# Patient Record
Sex: Female | Born: 1985 | Race: White | Hispanic: No | Marital: Single | Smoking: Never smoker
Health system: Southern US, Community
[De-identification: ages and names within clinical notes are randomized; demographics above are authoritative.]

## PROBLEM LIST (undated history)

## (undated) ENCOUNTER — Inpatient Hospital Stay (HOSPITAL_COMMUNITY): Payer: Self-pay

## (undated) DIAGNOSIS — N852 Hypertrophy of uterus: Secondary | ICD-10-CM

## (undated) DIAGNOSIS — E301 Precocious puberty: Secondary | ICD-10-CM

## (undated) DIAGNOSIS — R51 Headache: Secondary | ICD-10-CM

## (undated) DIAGNOSIS — D219 Benign neoplasm of connective and other soft tissue, unspecified: Secondary | ICD-10-CM

## (undated) DIAGNOSIS — R519 Headache, unspecified: Secondary | ICD-10-CM

## (undated) DIAGNOSIS — E2839 Other primary ovarian failure: Secondary | ICD-10-CM

## (undated) DIAGNOSIS — M0230 Reiter's disease, unspecified site: Secondary | ICD-10-CM

---
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
## 2015-08-25 ENCOUNTER — Encounter: Payer: Self-pay | Admitting: Obstetrics & Gynecology

## 2016-03-01 DIAGNOSIS — E663 Overweight: Secondary | ICD-10-CM | POA: Insufficient documentation

## 2016-09-05 ENCOUNTER — Encounter: Payer: Self-pay | Admitting: Obstetrics & Gynecology

## 2016-09-06 ENCOUNTER — Encounter: Payer: Self-pay | Admitting: Obstetrics & Gynecology

## 2017-09-15 ENCOUNTER — Encounter: Payer: Self-pay | Admitting: Obstetrics & Gynecology

## 2017-09-15 ENCOUNTER — Ambulatory Visit (INDEPENDENT_AMBULATORY_CARE_PROVIDER_SITE_OTHER): Payer: Managed Care, Other (non HMO) | Admitting: Obstetrics & Gynecology

## 2017-09-15 VITALS — BP 130/82 | HR 78 | Ht 62.0 in | Wt 161.0 lb

## 2017-09-15 DIAGNOSIS — Z124 Encounter for screening for malignant neoplasm of cervix: Secondary | ICD-10-CM | POA: Diagnosis not present

## 2017-09-15 DIAGNOSIS — D219 Benign neoplasm of connective and other soft tissue, unspecified: Secondary | ICD-10-CM

## 2017-09-15 DIAGNOSIS — Z1151 Encounter for screening for human papillomavirus (HPV): Secondary | ICD-10-CM | POA: Diagnosis not present

## 2017-09-15 DIAGNOSIS — Z01419 Encounter for gynecological examination (general) (routine) without abnormal findings: Secondary | ICD-10-CM

## 2017-09-15 DIAGNOSIS — Z113 Encounter for screening for infections with a predominantly sexual mode of transmission: Secondary | ICD-10-CM

## 2017-09-15 DIAGNOSIS — N96 Recurrent pregnancy loss: Secondary | ICD-10-CM

## 2017-09-15 NOTE — Progress Notes (Signed)
First menses at 32 years old. Hx of x3 spontaneous abortion all first trimester losses.

## 2017-09-15 NOTE — Patient Instructions (Signed)
Recurrent Pregnancy Loss Recurrent pregnancy loss is the loss of three or more pregnancies before 20 weeks of gestation. Losing three or more pregnancies in a row is rare. What are the causes? The most common cause of recurrent pregnancy loss is an abnormal number of chromosomes in the developing baby (fetus). Chromosomes are the structures inside cells that hold all your genetic material. In most cases of recurrent pregnancy loss, a missing or extra chromosome keeps a baby from developing. It may not be possible to identify which chromosome is defective. Chromosome abnormalities can be inherited, but most of the time they occur by chance. Other possible causes of recurrent pregnancy loss include:  Being born with an abnormal womb structure (septate uterus).  Having noncancerous growths in your uterus (fibroids or polyps).  Having a disease that causes scarring in your uterus (Asherman syndrome).  Having a disease that causes your blood to clot (antiphospholipid syndrome).  Having a disease that increases bleeding (thrombophilia).  What increases the risk? The risk of recurrent pregnancy loss increases as your age increases. Other risk factors include:  Diabetes.  Thyroid disease.  Obesity.  Smoking.  Excessive alcohol or caffeine.  Recreational drug use.  What are the signs or symptoms?  Vaginal bleeding.  Passing clots vaginally.  Abdominal pain or cramps.  Low back pain. How is this diagnosed? Your health care provider can create an image of your womb using sound waves and a computer (ultrasound) to confirm your pregnancy by 5 or 6 weeks after you conceive. Ultrasound can also confirm a pregnancy loss. Your health care provider may do a complete physical exam, including your vagina and uterus (pelvic exam), to find possible causes of recurrent pregnancy loss. Other tests may include:  Ultrasound imaging to see if the structure of your uterus is normal or if you have  polyps or fibroids.  Blood tests to see if you have a disease that causes recurrent pregnancy loss.  Blood tests to see if your blood clots normally.  Genetic testing of you and your partner.  How is this treated? In many cases, there is no specific treatment. Depending on the cause, possible treatments include:  Having your eggs fertilized outside your uterus (in vitro fertilization). By doing this, a health care provider may be able to select eggs without chromosome abnormalities.  Taking a blood thinner to prevent clotting if you have antiphospholipid syndrome.  Having corrective surgery if you have an abnormality in your uterus.  Follow these instructions at home: Follow all your health care provider's home instructions carefully. These may include:  Do not smoke or use recreational drugs.  Do not drink alcohol if you are pregnant.  Do not put anything in your vagina or have sex for 2 weeks after you have had a miscarriage.  If you are Rh negative and have had a miscarriage, you may need to get a shot of Rho (D) immune globulin. Ask your doctor if you need this shot.  Use birth control if you do not want to get pregnant. You can get pregnant [redacted] weeks after a miscarriage.  Get support from friends and loved ones. Miscarriage can be a sad and stressful event.  Contact a health care provider if:  You have light vaginal bleeding or spotting while pregnant.  You have been trying to get pregnant without success.  You are struggling with sadness or depression after a miscarriage. Get help right away if:  You have heavy vaginal bleeding and abdominal cramps.  You  have fever, chills, and severe abdominal pain. This information is not intended to replace advice given to you by your health care provider. Make sure you discuss any questions you have with your health care provider. Document Released: 09/18/2007 Document Revised: 09/07/2015 Document Reviewed: 01/22/2013 Elsevier  Interactive Patient Education  2018 [employer].

## 2017-09-15 NOTE — Progress Notes (Signed)
Subjective:     April Weiss is a 32 y.o. female here for a routine exam. G3P0030  LMP  08/22/2017 25-26 day cycle. Cycles last last 5 days. Current complaints:  Pt has had 3 SABs. She has had a pregnancy every 2 years. SAB 11 weeks in 2014; SAB 13 weeks 2017 and SAB 9 weeks 2018. Pt has never had a workup for recurrent SABs. Pt has never been on contraception. Pt has been married for 7 years. Pt has no prev sexual partners. Spouse has no children.  Pts husband is prediabetic. He has a not of anxiety (not diagnosed).   Pt passed all tissue spontaneously. The last 2 were missed ABs and pt received Cytotec.  She was seen at some point to by [facility] and had an US done as was told that she had a insignificant uterine septum. She was also told that she had a fibroid.  She denies heavy bleeding.        Gynecologic History No LMP recorded. Contraception: none Last Pap:  3 years prev. Results were: normal Last mammogram: n/a.   Obstetric History OB History  Gravida Para Term Preterm AB Living  3       2    SAB TAB Ectopic Multiple Live Births  2            # Outcome Date GA Lbr Len/2nd Weight Sex Delivery Anes PTL Lv  3 SAB 2017          2 SAB 2014          1 Gravida             The following portions of the patient's history were reviewed and updated as appropriate: allergies, current medications, past family history, past medical history, past social history, past surgical history and problem list.  Review of Systems Pertinent items are noted in HPI.    Objective:  BP 130/82   Pulse 78   Ht 5\' 2"  (1.575 m)   Wt 161 lb (73 kg)   LMP 07/23/2017 (Exact Date)   BMI 29.45 kg/m  General Appearance:    Alert, cooperative, no distress, appears stated age  Head:    Normocephalic, without obvious abnormality, atraumatic  Eyes:    conjunctiva/corneas clear, EOM's intact, both eyes  Ears:    Normal external ear canals, both ears  Nose:   Nares normal, septum midline, mucosa normal, no drainage     or sinus tenderness  Throat:   Lips, mucosa, and tongue normal; teeth and gums normal  Neck:   Supple, symmetrical, trachea midline, no adenopathy;    thyroid:  no enlargement/tenderness/nodules  Back:     Symmetric, no curvature, ROM normal, no CVA tenderness  Lungs:     Clear to auscultation bilaterally, respirations unlabored  Chest Wall:    No tenderness or deformity   Heart:    Regular rate and rhythm, S1 and S2 normal, no murmur, rub   or gallop  Breast Exam:    No tenderness, masses, or nipple abnormality  Abdomen:     Soft, non-tender, bowel sounds active all four quadrants,    no masses, no organomegaly  Genitalia:    Normal female without lesion, discharge or tenderness; the uterus is enlarged to 10-12 weeks. Difficult to fully assess due to pts body habitus.      Extremities:   Extremities normal, atraumatic, no cyanosis or edema  Pulses:   2+ and symmetric all extremities  Skin:   Skin  color, texture, turgor normal, no rashes or lesions     Assessment:    Healthy female exam.   recurrent pregnancy loss H/o uterine fibroids   Plan:   F/u PAP and cx Labs: anticardiolipin IgG and IgM; Lupus anticoagulant; HgbA1c, TSH GYN [place] Pt will f/u in 4 weeks. Consider dx hysteroscopy to eval endometrium  PNV 1 po q day  Lakiya Cottam L. Harraway-Smith, M.D., [place]     Keep PNV

## 2017-09-17 LAB — HEMOGLOBIN A1C
Est. average glucose Bld gHb Est-mCnc: 111 mg/dL
HEMOGLOBIN A1C: 5.5 % (ref 4.8–5.6)

## 2017-09-17 LAB — CARDIOLIPIN ANTIBODIES, IGM+IGG
Anticardiolipin IgG: <9 GPL U/mL (ref 0–14)
Anticardiolipin IgM: <9 MPL U/mL (ref 0–12)

## 2017-09-17 LAB — CYTOLOGY - PAP
CHLAMYDIA, DNA PROBE: NEGATIVE
Diagnosis: NEGATIVE
HPV: NOT DETECTED
Neisseria Gonorrhea: NEGATIVE

## 2017-09-17 LAB — LUPUS ANTICOAGULANT PANEL
DRVVT: 29.9 s (ref 0.0–47.0)
PTT LA: 36.6 s (ref 0.0–51.9)

## 2017-09-17 LAB — TSH: TSH: 4.35 u[IU]/mL (ref 0.450–4.500)

## 2017-09-20 ENCOUNTER — Ambulatory Visit (HOSPITAL_BASED_OUTPATIENT_CLINIC_OR_DEPARTMENT_OTHER)
Admission: RE | Admit: 2017-09-20 | Discharge: 2017-09-20 | Disposition: A | Discharge status: Home / Self Care | Payer: Managed Care, Other (non HMO) | Source: Ambulatory Visit | Attending: Obstetrics & Gynecology | Admitting: Obstetrics & Gynecology

## 2017-09-20 DIAGNOSIS — D219 Benign neoplasm of connective and other soft tissue, unspecified: Secondary | ICD-10-CM

## 2017-09-20 DIAGNOSIS — N96 Recurrent pregnancy loss: Secondary | ICD-10-CM

## 2017-09-20 DIAGNOSIS — D259 Leiomyoma of uterus, unspecified: Secondary | ICD-10-CM | POA: Insufficient documentation

## 2017-09-23 ENCOUNTER — Telehealth: Payer: Self-pay

## 2017-09-23 NOTE — Telephone Encounter (Signed)
Called pt with results. Got vm. Left message to call our office back.

## 2017-09-23 NOTE — Telephone Encounter (Signed)
-----   Message from Lavonia Drafts, MD sent at 09/23/2017 11:38 AM EDT ----- please call pt. All of her labs are normal.  Can discuss the [place] more when she comes in on the 3rd. Small fibroid noted.  THx, clh-S

## 2017-09-24 NOTE — Telephone Encounter (Signed)
Patient made aware that lab results normal and that her ultrasound did show a small fibroid. Patient made aware that Dr. Ihor Dow will discuss this further with her at that appointment. Kathrene Alu RN

## 2017-09-24 NOTE — Progress Notes (Signed)
Patient made aware of results and plans to come to follow up appointment on July 3rd

## 2017-09-24 NOTE — Telephone Encounter (Signed)
-----   Message from Lavonia Drafts, MD sent at 09/23/2017 11:38 AM EDT ----- please call pt. All of her labs are normal.  Can discuss the [place] more when she comes in on the 3rd. Small fibroid noted.  THx, clh-S

## 2017-09-29 ENCOUNTER — Observation Stay (HOSPITAL_COMMUNITY)
Admission: AD | Admit: 2017-09-29 | Discharge: 2017-09-30 | Disposition: A | Discharge status: Home / Self Care | Payer: Managed Care, Other (non HMO) | Source: Ambulatory Visit | Attending: General Surgery | Admitting: General Surgery

## 2017-09-29 ENCOUNTER — Other Ambulatory Visit: Payer: Self-pay

## 2017-09-29 ENCOUNTER — Telehealth: Payer: Self-pay

## 2017-09-29 ENCOUNTER — Encounter (HOSPITAL_COMMUNITY): Payer: Self-pay

## 2017-09-29 ENCOUNTER — Encounter (HOSPITAL_COMMUNITY)
Admission: AD | Disposition: A | Discharge status: Home / Self Care | Payer: Self-pay | Source: Ambulatory Visit | Attending: Emergency Medicine

## 2017-09-29 ENCOUNTER — Inpatient Hospital Stay (HOSPITAL_COMMUNITY): Payer: Managed Care, Other (non HMO) | Admitting: Certified Registered Nurse Anesthetist

## 2017-09-29 ENCOUNTER — Ambulatory Visit: Payer: Managed Care, Other (non HMO) | Admitting: *Deleted

## 2017-09-29 ENCOUNTER — Inpatient Hospital Stay (HOSPITAL_COMMUNITY): Payer: Managed Care, Other (non HMO)

## 2017-09-29 DIAGNOSIS — K3580 Unspecified acute appendicitis: Secondary | ICD-10-CM | POA: Diagnosis not present

## 2017-09-29 DIAGNOSIS — Z8249 Family history of ischemic heart disease and other diseases of the circulatory system: Secondary | ICD-10-CM | POA: Diagnosis not present

## 2017-09-29 DIAGNOSIS — K3589 Other acute appendicitis without perforation or gangrene: Secondary | ICD-10-CM | POA: Diagnosis present

## 2017-09-29 DIAGNOSIS — M0230 Reiter's disease, unspecified site: Secondary | ICD-10-CM | POA: Insufficient documentation

## 2017-09-29 DIAGNOSIS — N852 Hypertrophy of uterus: Secondary | ICD-10-CM | POA: Diagnosis not present

## 2017-09-29 DIAGNOSIS — Z880 Allergy status to penicillin: Secondary | ICD-10-CM | POA: Diagnosis not present

## 2017-09-29 HISTORY — DX: Other primary ovarian failure: E28.39

## 2017-09-29 HISTORY — DX: Headache, unspecified: R51.9

## 2017-09-29 HISTORY — DX: Precocious puberty: E30.1

## 2017-09-29 HISTORY — DX: Benign neoplasm of connective and other soft tissue, unspecified: D21.9

## 2017-09-29 HISTORY — DX: Headache: R51

## 2017-09-29 HISTORY — DX: Hypertrophy of uterus: N85.2

## 2017-09-29 HISTORY — PX: LAPAROSCOPIC APPENDECTOMY: SHX408

## 2017-09-29 HISTORY — DX: Reiter's disease, unspecified site: M02.30

## 2017-09-29 LAB — CBC WITH DIFFERENTIAL/PLATELET
Basophils Absolute: 0 10*3/uL (ref 0.0–0.1)
Basophils Relative: 0 %
EOS ABS: 0.3 10*3/uL (ref 0.0–0.7)
Eosinophils Relative: 2 %
HEMATOCRIT: 37.6 % (ref 36.0–46.0)
HEMOGLOBIN: 12.7 g/dL (ref 12.0–15.0)
Lymphocytes Relative: 14 %
Lymphs Abs: 2.1 10*3/uL (ref 0.7–4.0)
MCH: 28.9 pg (ref 26.0–34.0)
MCHC: 33.8 g/dL (ref 30.0–36.0)
MCV: 85.5 fL (ref 78.0–100.0)
MONOS PCT: 2 %
Monocytes Absolute: 0.3 10*3/uL (ref 0.1–1.0)
NEUTROS ABS: 12.1 10*3/uL — AB (ref 1.7–7.7)
NEUTROS PCT: 82 %
Platelets: 282 10*3/uL (ref 150–400)
RBC: 4.4 MIL/uL (ref 3.87–5.11)
RDW: 13.4 % (ref 11.5–15.5)
WBC: 14.7 10*3/uL — ABNORMAL HIGH (ref 4.0–10.5)

## 2017-09-29 LAB — URINALYSIS, ROUTINE W REFLEX MICROSCOPIC
BILIRUBIN URINE: NEGATIVE
GLUCOSE, UA: NEGATIVE mg/dL
KETONES UR: NEGATIVE mg/dL
NITRITE: NEGATIVE
PH: 5 (ref 5.0–8.0)
PROTEIN: NEGATIVE mg/dL
Specific Gravity, Urine: 1.023 (ref 1.005–1.030)

## 2017-09-29 LAB — BASIC METABOLIC PANEL
ANION GAP: 7 (ref 5–15)
BUN: 13 mg/dL (ref 6–20)
CHLORIDE: 104 mmol/L (ref 101–111)
CO2: 26 mmol/L (ref 22–32)
Calcium: 9.1 mg/dL (ref 8.9–10.3)
Creatinine, Ser: 0.55 mg/dL (ref 0.44–1.00)
GFR calc Af Amer: >60 mL/min (ref >60)
GFR calc non Af Amer: >60 mL/min (ref >60)
GLUCOSE: 93 mg/dL (ref 65–99)
POTASSIUM: 3.6 mmol/L (ref 3.5–5.1)
Sodium: 137 mmol/L (ref 135–145)

## 2017-09-29 LAB — POCT PREGNANCY, URINE: Preg Test, Ur: NEGATIVE

## 2017-09-29 LAB — T4, FREE: Free T4: 0.86 ng/dL (ref 0.82–1.77)

## 2017-09-29 SURGERY — APPENDECTOMY, LAPAROSCOPIC
Anesthesia: General | Site: Abdomen

## 2017-09-29 MED ORDER — OXYCODONE HCL 5 MG/5ML PO SOLN
5.0000 mg | Freq: Once | ORAL | Status: DC | PRN
  Filled 2017-09-29: qty 5

## 2017-09-29 MED ORDER — BUPIVACAINE-EPINEPHRINE 0.25% -1:200000 IJ SOLN
INTRAMUSCULAR | Status: DC | PRN
  Administered 2017-09-29: 20 mL

## 2017-09-29 MED ORDER — HEPARIN SODIUM (PORCINE) 5000 UNIT/ML IJ SOLN
5000.0000 [IU] | Freq: Three times a day (TID) | INTRAMUSCULAR | Status: DC
  Administered 2017-09-30: 5000 [IU] via SUBCUTANEOUS
  Filled 2017-09-29: qty 1

## 2017-09-29 MED ORDER — DEXAMETHASONE SODIUM PHOSPHATE 10 MG/ML IJ SOLN
INTRAMUSCULAR | Status: AC
  Filled 2017-09-29: qty 1

## 2017-09-29 MED ORDER — ONDANSETRON HCL 4 MG/2ML IJ SOLN
INTRAMUSCULAR | Status: DC | PRN
  Administered 2017-09-29: 4 mg via INTRAVENOUS

## 2017-09-29 MED ORDER — SUGAMMADEX SODIUM 200 MG/2ML IV SOLN
INTRAVENOUS | Status: AC
  Filled 2017-09-29: qty 2

## 2017-09-29 MED ORDER — CIPROFLOXACIN IN D5W 400 MG/200ML IV SOLN
INTRAVENOUS | Status: DC | PRN
  Administered 2017-09-29: 400 mg via INTRAVENOUS

## 2017-09-29 MED ORDER — LACTATED RINGERS IR SOLN
Status: DC | PRN
  Administered 2017-09-29: 1000 mL

## 2017-09-29 MED ORDER — BUPIVACAINE-EPINEPHRINE (PF) 0.25% -1:200000 IJ SOLN
INTRAMUSCULAR | Status: AC
  Filled 2017-09-29: qty 30

## 2017-09-29 MED ORDER — METRONIDAZOLE IN NACL 5-0.79 MG/ML-% IV SOLN
INTRAVENOUS | Status: AC
  Filled 2017-09-29: qty 100

## 2017-09-29 MED ORDER — MEPERIDINE HCL 50 MG/ML IJ SOLN: 6.2500 mg | INTRAMUSCULAR | Status: DC | PRN

## 2017-09-29 MED ORDER — LACTATED RINGERS IV SOLN
INTRAVENOUS | Status: DC
  Administered 2017-09-29: 23:00:00 via INTRAVENOUS

## 2017-09-29 MED ORDER — DEXAMETHASONE SODIUM PHOSPHATE 4 MG/ML IJ SOLN
INTRAMUSCULAR | Status: DC | PRN
  Administered 2017-09-29: 10 mg via INTRAVENOUS

## 2017-09-29 MED ORDER — ACETAMINOPHEN 325 MG PO TABS: 325.0000 mg | ORAL_TABLET | ORAL | Status: DC | PRN

## 2017-09-29 MED ORDER — IOPAMIDOL (ISOVUE-300) INJECTION 61%
100.0000 mL | Freq: Once | INTRAVENOUS | Status: AC | PRN
  Administered 2017-09-29: 100 mL via INTRAVENOUS

## 2017-09-29 MED ORDER — FENTANYL CITRATE (PF) 100 MCG/2ML IJ SOLN
INTRAMUSCULAR | Status: AC
  Filled 2017-09-29: qty 2

## 2017-09-29 MED ORDER — HYDROMORPHONE HCL 1 MG/ML IJ SOLN
0.5000 mg | INTRAMUSCULAR | Status: DC | PRN
Start: 2017-09-29 — End: 2017-09-30

## 2017-09-29 MED ORDER — OXYCODONE HCL 5 MG PO TABS: 5.0000 mg | ORAL_TABLET | Freq: Once | ORAL | Status: DC | PRN

## 2017-09-29 MED ORDER — SCOPOLAMINE 1 MG/3DAYS TD PT72
MEDICATED_PATCH | TRANSDERMAL | Status: DC | PRN
  Administered 2017-09-29: 1 via TRANSDERMAL

## 2017-09-29 MED ORDER — PROPOFOL 10 MG/ML IV BOLUS
INTRAVENOUS | Status: AC
  Filled 2017-09-29: qty 20

## 2017-09-29 MED ORDER — METRONIDAZOLE IN NACL 5-0.79 MG/ML-% IV SOLN
INTRAVENOUS | Status: DC | PRN
  Administered 2017-09-29: 500 mg via INTRAVENOUS

## 2017-09-29 MED ORDER — ACETAMINOPHEN 160 MG/5ML PO SOLN: 325.0000 mg | ORAL | Status: DC | PRN

## 2017-09-29 MED ORDER — LIDOCAINE 2% (20 MG/ML) 5 ML SYRINGE
INTRAMUSCULAR | Status: AC
  Filled 2017-09-29: qty 5

## 2017-09-29 MED ORDER — ONDANSETRON 4 MG PO TBDP: 4.0000 mg | ORAL_TABLET | Freq: Four times a day (QID) | ORAL | Status: DC | PRN

## 2017-09-29 MED ORDER — ONDANSETRON HCL 4 MG/2ML IJ SOLN: 4.0000 mg | Freq: Four times a day (QID) | INTRAMUSCULAR | Status: DC | PRN

## 2017-09-29 MED ORDER — DIPHENHYDRAMINE HCL 50 MG/ML IJ SOLN: 12.5000 mg | Freq: Four times a day (QID) | INTRAMUSCULAR | Status: DC | PRN

## 2017-09-29 MED ORDER — MIDAZOLAM HCL 2 MG/2ML IJ SOLN
INTRAMUSCULAR | Status: AC
  Filled 2017-09-29: qty 2

## 2017-09-29 MED ORDER — SUGAMMADEX SODIUM 200 MG/2ML IV SOLN
INTRAVENOUS | Status: DC | PRN
  Administered 2017-09-29: 200 mg via INTRAVENOUS

## 2017-09-29 MED ORDER — ROCURONIUM BROMIDE 100 MG/10ML IV SOLN
INTRAVENOUS | Status: AC
  Filled 2017-09-29: qty 1

## 2017-09-29 MED ORDER — DIPHENHYDRAMINE HCL 12.5 MG/5ML PO ELIX: 12.5000 mg | ORAL_SOLUTION | Freq: Four times a day (QID) | ORAL | Status: DC | PRN

## 2017-09-29 MED ORDER — PHENYLEPHRINE 40 MCG/ML (10ML) SYRINGE FOR IV PUSH (FOR BLOOD PRESSURE SUPPORT)
PREFILLED_SYRINGE | INTRAVENOUS | Status: DC | PRN
  Administered 2017-09-29 (×3): 80 ug via INTRAVENOUS

## 2017-09-29 MED ORDER — BUPIVACAINE HCL (PF) 0.5 % IJ SOLN
INTRAMUSCULAR | Status: AC
  Filled 2017-09-29: qty 30

## 2017-09-29 MED ORDER — MIDAZOLAM HCL 5 MG/5ML IJ SOLN
INTRAMUSCULAR | Status: DC | PRN
  Administered 2017-09-29: 2 mg via INTRAVENOUS

## 2017-09-29 MED ORDER — PROPOFOL 10 MG/ML IV BOLUS
INTRAVENOUS | Status: DC | PRN
  Administered 2017-09-29: 150 mg via INTRAVENOUS

## 2017-09-29 MED ORDER — HYDRALAZINE HCL 20 MG/ML IJ SOLN: 10.0000 mg | INTRAMUSCULAR | Status: DC | PRN

## 2017-09-29 MED ORDER — TRAMADOL HCL 50 MG PO TABS: 50.0000 mg | ORAL_TABLET | Freq: Four times a day (QID) | ORAL | Status: DC | PRN

## 2017-09-29 MED ORDER — ONDANSETRON HCL 4 MG/2ML IJ SOLN: 4.0000 mg | Freq: Once | INTRAMUSCULAR | Status: DC | PRN

## 2017-09-29 MED ORDER — DOCUSATE SODIUM 100 MG PO CAPS
200.0000 mg | ORAL_CAPSULE | Freq: Two times a day (BID) | ORAL | Status: DC
  Administered 2017-09-30: 200 mg via ORAL
  Filled 2017-09-29: qty 2

## 2017-09-29 MED ORDER — IBUPROFEN 200 MG PO TABS
600.0000 mg | ORAL_TABLET | Freq: Four times a day (QID) | ORAL | Status: DC
  Administered 2017-09-29 – 2017-09-30 (×3): 600 mg via ORAL
  Filled 2017-09-29 (×3): qty 3

## 2017-09-29 MED ORDER — CIPROFLOXACIN IN D5W 400 MG/200ML IV SOLN
INTRAVENOUS | Status: AC
  Filled 2017-09-29: qty 200

## 2017-09-29 MED ORDER — 0.9 % SODIUM CHLORIDE (POUR BTL) OPTIME
TOPICAL | Status: DC | PRN
  Administered 2017-09-29: 1000 mL

## 2017-09-29 MED ORDER — FENTANYL CITRATE (PF) 100 MCG/2ML IJ SOLN
INTRAMUSCULAR | Status: DC | PRN
  Administered 2017-09-29 (×4): 50 ug via INTRAVENOUS

## 2017-09-29 MED ORDER — ACETAMINOPHEN 500 MG PO TABS
1000.0000 mg | ORAL_TABLET | Freq: Four times a day (QID) | ORAL | Status: DC
  Administered 2017-09-30: 1000 mg via ORAL
  Filled 2017-09-29: qty 2

## 2017-09-29 MED ORDER — ONDANSETRON HCL 4 MG/2ML IJ SOLN
INTRAMUSCULAR | Status: AC
  Filled 2017-09-29: qty 2

## 2017-09-29 MED ORDER — FENTANYL CITRATE (PF) 100 MCG/2ML IJ SOLN
25.0000 ug | INTRAMUSCULAR | Status: DC | PRN
  Administered 2017-09-29: 25 ug via INTRAVENOUS

## 2017-09-29 MED ORDER — FENTANYL CITRATE (PF) 100 MCG/2ML IJ SOLN
INTRAMUSCULAR | Status: AC
  Administered 2017-09-29: 25 ug via INTRAVENOUS
  Filled 2017-09-29: qty 2

## 2017-09-29 MED ORDER — LACTATED RINGERS IV SOLN
INTRAVENOUS | Status: DC | PRN
  Administered 2017-09-29 (×2): via INTRAVENOUS

## 2017-09-29 MED ORDER — SIMETHICONE 80 MG PO CHEW: 40.0000 mg | CHEWABLE_TABLET | Freq: Four times a day (QID) | ORAL | Status: DC | PRN

## 2017-09-29 MED ORDER — SCOPOLAMINE 1 MG/3DAYS TD PT72
MEDICATED_PATCH | TRANSDERMAL | Status: AC
  Filled 2017-09-29: qty 1

## 2017-09-29 MED ORDER — SUCCINYLCHOLINE CHLORIDE 200 MG/10ML IV SOSY
PREFILLED_SYRINGE | INTRAVENOUS | Status: AC
  Filled 2017-09-29: qty 10

## 2017-09-29 MED ORDER — PHENYLEPHRINE 40 MCG/ML (10ML) SYRINGE FOR IV PUSH (FOR BLOOD PRESSURE SUPPORT)
PREFILLED_SYRINGE | INTRAVENOUS | Status: AC
  Filled 2017-09-29: qty 10

## 2017-09-29 MED ORDER — SUCCINYLCHOLINE CHLORIDE 200 MG/10ML IV SOSY
PREFILLED_SYRINGE | INTRAVENOUS | Status: DC | PRN
  Administered 2017-09-29: 100 mg via INTRAVENOUS

## 2017-09-29 MED ORDER — LIDOCAINE 2% (20 MG/ML) 5 ML SYRINGE
INTRAMUSCULAR | Status: DC | PRN
  Administered 2017-09-29: 100 mg via INTRAVENOUS

## 2017-09-29 MED ORDER — ROCURONIUM BROMIDE 10 MG/ML (PF) SYRINGE
PREFILLED_SYRINGE | INTRAVENOUS | Status: DC | PRN
  Administered 2017-09-29: 30 mg via INTRAVENOUS
  Administered 2017-09-29: 10 mg via INTRAVENOUS

## 2017-09-29 SURGICAL SUPPLY — 43 items
APPLIER CLIP 5 13 M/L LIGAMAX5 (MISCELLANEOUS)
APPLIER CLIP ROT 10 11.4 M/L (STAPLE)
BENZOIN TINCTURE PRP APPL 2/3 (GAUZE/BANDAGES/DRESSINGS) ×3 IMPLANT
CABLE HIGH FREQUENCY MONO STRZ (ELECTRODE) ×3 IMPLANT
CHLORAPREP W/TINT 26ML (MISCELLANEOUS) ×3 IMPLANT
CLIP APPLIE 5 13 M/L LIGAMAX5 (MISCELLANEOUS) IMPLANT
CLIP APPLIE ROT 10 11.4 M/L (STAPLE) IMPLANT
CLOSURE WOUND 1/2 X4 (GAUZE/BANDAGES/DRESSINGS) ×1
CUTTER FLEX LINEAR 45M (STAPLE) ×3 IMPLANT
DECANTER SPIKE VIAL GLASS SM (MISCELLANEOUS) ×3 IMPLANT
DRAIN CHANNEL 19F RND (DRAIN) IMPLANT
DRAPE LAPAROSCOPIC ABDOMINAL (DRAPES) ×3 IMPLANT
DRSG TEGADERM 2-3/8X2-3/4 SM (GAUZE/BANDAGES/DRESSINGS) ×9 IMPLANT
ELECT REM PT RETURN 15FT ADLT (MISCELLANEOUS) ×3 IMPLANT
ENDOLOOP SUT PDS II  0 18 (SUTURE)
ENDOLOOP SUT PDS II 0 18 (SUTURE) IMPLANT
EVACUATOR SILICONE 100CC (DRAIN) IMPLANT
GAUZE SPONGE 2X2 8PLY STRL LF (GAUZE/BANDAGES/DRESSINGS) ×1 IMPLANT
GLOVE ECLIPSE 8.0 STRL XLNG CF (GLOVE) ×3 IMPLANT
GLOVE INDICATOR 8.0 STRL GRN (GLOVE) ×3 IMPLANT
GOWN STRL REUS W/TWL XL LVL3 (GOWN DISPOSABLE) ×6 IMPLANT
KIT BASIN OR (CUSTOM PROCEDURE TRAY) ×3 IMPLANT
PENCIL HANDSWITCHING (ELECTRODE) ×3 IMPLANT
POUCH SPECIMEN RETRIEVAL 10MM (ENDOMECHANICALS) ×3 IMPLANT
RELOAD 45 VASCULAR/THIN (ENDOMECHANICALS) IMPLANT
RELOAD STAPLE TA45 3.5 REG BLU (ENDOMECHANICALS) ×3 IMPLANT
SCISSORS LAP 5X35 DISP (ENDOMECHANICALS) IMPLANT
SET IRRIG TUBING LAPAROSCOPIC (IRRIGATION / IRRIGATOR) ×3 IMPLANT
SHEARS CURVED HARMONIC AC 45CM (MISCELLANEOUS) ×3 IMPLANT
SHEARS HARMONIC ACE PLUS 36CM (ENDOMECHANICALS) IMPLANT
SLEEVE XCEL OPT CAN 5 100 (ENDOMECHANICALS) ×3 IMPLANT
SPONGE GAUZE 2X2 STER 10/PKG (GAUZE/BANDAGES/DRESSINGS) ×2
STRIP CLOSURE SKIN 1/2X4 (GAUZE/BANDAGES/DRESSINGS) ×2 IMPLANT
SUT ETHILON 3 0 PS 1 (SUTURE) IMPLANT
SUT MNCRL AB 4-0 PS2 18 (SUTURE) ×3 IMPLANT
TOWEL OR 17X26 10 PK STRL BLUE (TOWEL DISPOSABLE) ×3 IMPLANT
TOWEL OR NON WOVEN STRL DISP B (DISPOSABLE) ×3 IMPLANT
TRAY FOLEY MTR SLVR 14FR STAT (SET/KITS/TRAYS/PACK) IMPLANT
TRAY FOLEY MTR SLVR 16FR STAT (SET/KITS/TRAYS/PACK) IMPLANT
TRAY LAPAROSCOPIC (CUSTOM PROCEDURE TRAY) ×3 IMPLANT
TROCAR BLADELESS OPT 5 100 (ENDOMECHANICALS) ×3 IMPLANT
TROCAR XCEL BLUNT TIP 100MML (ENDOMECHANICALS) ×3 IMPLANT
TUBING INSUF HEATED (TUBING) ×3 IMPLANT

## 2017-09-29 NOTE — ED Notes (Signed)
Bed: WEMS01 Expected date:  Expected time:  Means of arrival:  Comments: EMS-[facility]

## 2017-09-29 NOTE — MAU Note (Signed)
Pt presents to MAU with complaints of lower abdominal pain with pelvic pressure. LMP 08/22/17 NEG pregnancy test History of fibroids and cysts .

## 2017-09-29 NOTE — Telephone Encounter (Signed)
Patient stating that she is now two weeks late with her period. An at home pregnancy test she took today was negative. Patient would like to have blood drawn cause her breasts are tender as well.patient would like to go to [place] office since she lives in [place]- Will call office to see if she can be scheduled. Kathrene Alu RN

## 2017-09-29 NOTE — Plan of Care (Signed)
Care plan initiated.

## 2017-09-29 NOTE — Anesthesia Procedure Notes (Signed)
Procedure Name: Intubation Date/Time: 09/29/2017 7:04 PM Performed by: Claudia Desanctis, CRNA Pre-anesthesia Checklist: Patient identified, Emergency Drugs available, Suction available and Patient being monitored Patient Re-evaluated:Patient Re-evaluated prior to induction Oxygen Delivery Method: Circle system utilized Preoxygenation: Pre-oxygenation with 100% oxygen Induction Type: IV induction, Rapid sequence and Cricoid Pressure applied Laryngoscope Size: 2 and Miller Grade View: Grade I Tube type: Oral Tube size: 7.5 mm Number of attempts: 1 Airway Equipment and Method: Stylet Placement Confirmation: ETT inserted through vocal cords under direct vision,  positive ETCO2 and breath sounds checked- equal and bilateral Secured at: 21 cm Tube secured with: Tape Dental Injury: Teeth and Oropharynx as per pre-operative assessment

## 2017-09-29 NOTE — ED Provider Notes (Signed)
  Physical Exam  BP 131/86 (BP Location: Right Arm)   Pulse 91   Temp 98.3 F (36.8 C) (Oral)   Resp 16   Ht 5\' 2"  (1.575 m)   Wt 73 kg (161 lb)   LMP 08/22/2017   SpO2 100%   BMI 29.45 kg/m   Physical Exam  ED Course/Procedures     Procedures  MDM  Transfer from [facility] for acute appendicitis.  Well-appearing and surgeon is already in the room with her.       Davonna Belling, MD 09/29/17 1755

## 2017-09-29 NOTE — H&P (Addendum)
CC: Transferred from [facility] for evaluation for acute appendicitis  HPI: April Weiss is an 32 y.o. female with no known past medical history presented to [facility] earlier today with acute onset of right sided abdominal pain. Pain started when she woke this morning and was sharp/achy. She has never had this before. Pain did not radiate. She has had associated chills but no fever. Had loss of appetite as well. Nausea, but no emesis.  Past Medical History:  Diagnosis Date  . Early puberty   . Estrogen deficiency   . Fibroid   . Headache   . Large uterus   . 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 No pertinent surgical history.  Family History  Problem Relation Age of Onset  . Arthritis Father   . Hypertension Mother     Social:  reports that she has never smoked. She has never used smokeless tobacco. She reports that she does not drink alcohol or use drugs.  Allergies:  Allergies  Allergen Reactions  . Penicillins Hives    Has patient had a PCN reaction causing immediate rash, facial/tongue/throat swelling, SOB or lightheadedness with hypotension: Yes Has patient had a PCN reaction causing severe rash involving mucus membranes or skin necrosis: No Has patient had a PCN reaction that required hospitalization: No Has patient had a PCN reaction occurring within the last 10 years: No If all of the above answers are "NO", then may proceed with Cephalosporin use.    Medications: I have reviewed the patient's current medications.  Results for orders placed or performed during the hospital encounter of 09/29/17 (from the past 48 hour(s))  Urinalysis, Routine w reflex microscopic     Status: Abnormal   Collection Time: 09/29/17  1:08 PM  Result Value Ref Range   Color, Urine YELLOW YELLOW   APPearance HAZY (A) CLEAR   Specific Gravity, Urine 1.023 1.005 - 1.030   pH 5.0 5.0 - 8.0   Glucose, UA NEGATIVE NEGATIVE mg/dL   Hgb urine dipstick SMALL (A) NEGATIVE     Bilirubin Urine NEGATIVE NEGATIVE   Ketones, ur NEGATIVE NEGATIVE mg/dL   Protein, ur NEGATIVE NEGATIVE mg/dL   Nitrite NEGATIVE NEGATIVE   Leukocytes, UA LARGE (A) NEGATIVE   RBC / HPF 0-5 0 - 5 RBC/hpf   WBC, UA >50 (H) 0 - 5 WBC/hpf   Bacteria, UA RARE (A) NONE SEEN   Squamous Epithelial / LPF 6-10 0 - 5   Mucus PRESENT     Comment: Performed at [facility], [address] [place], [place] [address]  Pregnancy, urine POC     Status: None   Collection Time: 09/29/17  1:29 PM  Result Value Ref Range   Preg Test, Ur NEGATIVE NEGATIVE    Comment:        THE SENSITIVITY OF THIS METHODOLOGY IS >24 mIU/mL   CBC with Differential     Status: Abnormal   Collection Time: 09/29/17  2:31 PM  Result Value Ref Range   WBC 14.7 (H) 4.0 - 10.5 K/uL   RBC 4.40 3.87 - 5.11 MIL/uL   Hemoglobin 12.7 12.0 - 15.0 g/dL   HCT 37.6 36.0 - 46.0 %   MCV 85.5 78.0 - 100.0 fL   MCH 28.9 26.0 - 34.0 pg   MCHC 33.8 30.0 - 36.0 g/dL   RDW 13.4 11.5 - 15.5 %   Platelets 282 150 - 400 K/uL   Neutrophils Relative % 82 %   Neutro Abs 12.1 (  H) 1.7 - 7.7 K/uL   Lymphocytes Relative 14 %   Lymphs Abs 2.1 0.7 - 4.0 K/uL   Monocytes Relative 2 %   Monocytes Absolute 0.3 0.1 - 1.0 K/uL   Eosinophils Relative 2 %   Eosinophils Absolute 0.3 0.0 - 0.7 K/uL   Basophils Relative 0 %   Basophils Absolute 0.0 0.0 - 0.1 K/uL    Comment: Performed at [facility], [address], [place], [place] [address]    Ct Abdomen Pelvis W Contrast  Result Date: 09/29/2017 CLINICAL DATA:  Right-sided abdominal pain since early this morning. EXAM: CT ABDOMEN AND PELVIS WITH CONTRAST TECHNIQUE: Multidetector CT imaging of the abdomen and pelvis was performed using the standard protocol following bolus administration of intravenous contrast. CONTRAST:  174mL ISOVUE-300 IOPAMIDOL (ISOVUE-300) INJECTION 61% COMPARISON:  None. FINDINGS: Lower chest: The lung bases are clear of acute process. No pleural effusion or  pulmonary lesions. The heart is normal in size. No pericardial effusion. The distal esophagus and aorta are unremarkable. Hepatobiliary: No focal hepatic lesions or intrahepatic biliary dilatation. The gallbladder is normal. No common bile duct dilatation. Pancreas: No mass, inflammation or ductal dilatation. Spleen: Normal size.  No focal lesions. Adrenals/Urinary Tract: The adrenal glands and kidneys are unremarkable. No renal, ureteral or bladder calculi or mass. Stomach/Bowel: The stomach, duodenum, small bowel and colon are unremarkable. No acute inflammatory changes, mass lesions or obstructive findings. The terminal ileum is normal. The appendix is abnormal. It is thickened, edematous and demonstrates surrounding inflammatory change. Maximum diameter is 12 mm. No definite appendicoliths. No findings to suggest ruptured appendicitis. Vascular/Lymphatic: The aorta is normal in caliber. No dissection. The branch vessels are patent. The major venous structures are patent. No mesenteric or retroperitoneal mass or adenopathy. Small scattered lymph nodes are noted. Reproductive: Large right-sided uterine fibroid. Both ovaries appear normal. Other: Small amount of free pelvic fluid noted. No pelvic adenopathy. No inguinal adenopathy. Musculoskeletal: No significant bony findings. IMPRESSION: 1. CT findings consistent with acute appendicitis. No appendicoliths or evidence of rupture. 2. Uterine fibroids. Electronically Signed   By: Marijo Sanes M.D.   On: 09/29/2017 16:18    ROS - all of the below systems have been reviewed with the patient and positives are indicated with bold text General: chills, fever or night sweats Eyes: blurry vision or double vision ENT: epistaxis or sore throat Allergy/Immunology: itchy/watery eyes or nasal congestion Hematologic/Lymphatic: bleeding problems, blood clots or swollen lymph nodes Endocrine: temperature intolerance or unexpected weight changes Breast: new or changing  breast lumps or nipple discharge Resp: cough, shortness of breath, or wheezing CV: chest pain or dyspnea on exertion GI: as per HPI GU: dysuria, trouble voiding, or hematuria MSK: joint pain or joint stiffness Neuro: TIA or stroke symptoms Derm: pruritus and skin lesion changes Psych: anxiety and depression  PE Blood pressure 135/88, pulse 91, temperature 98.9 F (37.2 C), temperature source Oral, resp. rate 16, height 5\' 2"  (1.575 m), weight 73 kg (161 lb), last menstrual period 08/22/2017, SpO2 100 %. Constitutional: NAD; conversant; no deformities Eyes: Moist conjunctiva; no lid lag; anicteric; PERRL Neck: Trachea midline; no thyromegaly Lungs: Normal respiratory effort; no tactile fremitus CV: RRR; no palpable thrills; no pitting edema GI: Abd soft, mildly ttp in RLQ; negative Rovsing's; nondistended; no palpable hepatosplenomegaly MSK: Normal gait; no clubbing/cyanosis Psychiatric: Appropriate affect; alert and oriented x3 Lymphatic: No palpable cervical or axillary lymphadenopathy  Results for orders placed or performed during the hospital encounter of 09/29/17 (from the past  48 hour(s))  Urinalysis, Routine w reflex microscopic     Status: Abnormal   Collection Time: 09/29/17  1:08 PM  Result Value Ref Range   Color, Urine YELLOW YELLOW   APPearance HAZY (A) CLEAR   Specific Gravity, Urine 1.023 1.005 - 1.030   pH 5.0 5.0 - 8.0   Glucose, UA NEGATIVE NEGATIVE mg/dL   Hgb urine dipstick SMALL (A) NEGATIVE   Bilirubin Urine NEGATIVE NEGATIVE   Ketones, ur NEGATIVE NEGATIVE mg/dL   Protein, ur NEGATIVE NEGATIVE mg/dL   Nitrite NEGATIVE NEGATIVE   Leukocytes, UA LARGE (A) NEGATIVE   RBC / HPF 0-5 0 - 5 RBC/hpf   WBC, UA >50 (H) 0 - 5 WBC/hpf   Bacteria, UA RARE (A) NONE SEEN   Squamous Epithelial / LPF 6-10 0 - 5   Mucus PRESENT     Comment: Performed at [facility], [address], [place], [place] [address]  Pregnancy, urine POC     Status: None   Collection  Time: 09/29/17  1:29 PM  Result Value Ref Range   Preg Test, Ur NEGATIVE NEGATIVE    Comment:        THE SENSITIVITY OF THIS METHODOLOGY IS >24 mIU/mL   CBC with Differential     Status: Abnormal   Collection Time: 09/29/17  2:31 PM  Result Value Ref Range   WBC 14.7 (H) 4.0 - 10.5 K/uL   RBC 4.40 3.87 - 5.11 MIL/uL   Hemoglobin 12.7 12.0 - 15.0 g/dL   HCT 37.6 36.0 - 46.0 %   MCV 85.5 78.0 - 100.0 fL   MCH 28.9 26.0 - 34.0 pg   MCHC 33.8 30.0 - 36.0 g/dL   RDW 13.4 11.5 - 15.5 %   Platelets 282 150 - 400 K/uL   Neutrophils Relative % 82 %   Neutro Abs 12.1 (H) 1.7 - 7.7 K/uL   Lymphocytes Relative 14 %   Lymphs Abs 2.1 0.7 - 4.0 K/uL   Monocytes Relative 2 %   Monocytes Absolute 0.3 0.1 - 1.0 K/uL   Eosinophils Relative 2 %   Eosinophils Absolute 0.3 0.0 - 0.7 K/uL   Basophils Relative 0 %   Basophils Absolute 0.0 0.0 - 0.1 K/uL    Comment: Performed at [facility], [address], [place], [place] [address]    Ct Abdomen Pelvis W Contrast  Result Date: 09/29/2017 CLINICAL DATA:  Right-sided abdominal pain since early this morning. EXAM: CT ABDOMEN AND PELVIS WITH CONTRAST TECHNIQUE: Multidetector CT imaging of the abdomen and pelvis was performed using the standard protocol following bolus administration of intravenous contrast. CONTRAST:  163mL ISOVUE-300 IOPAMIDOL (ISOVUE-300) INJECTION 61% COMPARISON:  None. FINDINGS: Lower chest: The lung bases are clear of acute process. No pleural effusion or pulmonary lesions. The heart is normal in size. No pericardial effusion. The distal esophagus and aorta are unremarkable. Hepatobiliary: No focal hepatic lesions or intrahepatic biliary dilatation. The gallbladder is normal. No common bile duct dilatation. Pancreas: No mass, inflammation or ductal dilatation. Spleen: Normal size.  No focal lesions. Adrenals/Urinary Tract: The adrenal glands and kidneys are unremarkable. No renal, ureteral or bladder calculi or mass. Stomach/Bowel:  The stomach, duodenum, small bowel and colon are unremarkable. No acute inflammatory changes, mass lesions or obstructive findings. The terminal ileum is normal. The appendix is abnormal. It is thickened, edematous and demonstrates surrounding inflammatory change. Maximum diameter is 12 mm. No definite appendicoliths. No findings to suggest ruptured appendicitis. Vascular/Lymphatic: The aorta is normal in  caliber. No dissection. The branch vessels are patent. The major venous structures are patent. No mesenteric or retroperitoneal mass or adenopathy. Small scattered lymph nodes are noted. Reproductive: Large right-sided uterine fibroid. Both ovaries appear normal. Other: Small amount of free pelvic fluid noted. No pelvic adenopathy. No inguinal adenopathy. Musculoskeletal: No significant bony findings. IMPRESSION: 1. CT findings consistent with acute appendicitis. No appendicoliths or evidence of rupture. 2. Uterine fibroids. Electronically Signed   By: Marijo Sanes M.D.   On: 09/29/2017 16:18    A/P: April Weiss is an 32 y.o. female with acute nonperforated appendicitis  -Urine pregnancy negative -The anatomy and physiology of the GI tract was discussed at length with the patient with associated pictures. The pathophysiology of appendicitis was discussed at length with associated illustrations. -The options for treatment were discussed - surgical and nonsurgical options. The risk of nonoperative management were elaborated (including but not limited to failure of nonoperative management and progression as well as subsequent recurrence after successful nonoperative management - up to 20-30% over 4-5 years). The surgical treatment option was discussed with its associated material risks (including, but not limited to, pain, bleeding, infection, scarring, need for blood transfusion, damage to surrounding structures- blood vessels/nerves/viscus/organs/bladder, leak from staple line, need for additional  procedures, hernia, pneumonia, heart attack, stroke, death) benefits and alternatives were discussed at length. The patient's questions were answered to her satisfaction, she voiced understanding and she opted to proceed with surgery. Additionally, we discussed typical postoperative expectations and the recovery process.  Sharon Mt. Dema Severin, M.D. [facility]

## 2017-09-29 NOTE — ED Notes (Signed)
Bed: TK18 Expected date:  Expected time:  Means of arrival:  Comments: [facility] transfer from [facility]

## 2017-09-29 NOTE — MAU Provider Note (Signed)
History     CSN: [id number]  Arrival date and time: 09/29/17 1249   First Provider Initiated Contact with Patient 09/29/17 1359      Chief Complaint  Patient presents with  . Pelvic Pain  . Abdominal Pain  . Possible Pregnancy   HPI   Ms.April Weiss is a 32 y.o. Female here with pelvic pain, and 2 weeks late on her menstrual cycle. Says she was having bad menstrual cramps this morning rated her abdominal pain 10/10; she took 400 mg of ibuprofen which helped some. + nausea, no vomiting. The pain is all over her abdomen. The pain was so bad that she could not tell if it was worse in the right or the left.  Called the office because she was in so much pain and they recommended she come to MAU.  Says she has a history of uterine fibroids which was told to her about 2 weeks ago. No fever, however has just not felt herself lately.   OB History    Gravida  3   Para      Term      Preterm      AB  3   Living        SAB  3   TAB      Ectopic      Multiple      Live Births              Past Medical History:  Diagnosis Date  . Early puberty   . Estrogen deficiency   . Fibroid   . Headache   . Large uterus   . 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 No pertinent surgical history.  Family History  Problem Relation Age of Onset  . Arthritis Father   . Hypertension Mother     Social History   Tobacco Use  . Smoking status: Never Smoker  . Smokeless tobacco: Never Used  Substance Use Topics  . Alcohol use: Never    Frequency: Never  . Drug use: Never    Allergies:  Allergies  Allergen Reactions  . Penicillins Hives    Medications Prior to Admission  Medication Sig Dispense Refill Last Dose  . Multiple Vitamin (MULTIVITAMIN) tablet Take 1 tablet by mouth daily.   Taking  . OVER THE COUNTER MEDICATION    Taking   Results for orders placed or performed during the hospital encounter of 09/29/17 (from the past 48 hour(s))  Urinalysis, Routine  w reflex microscopic     Status: Abnormal   Collection Time: 09/29/17  1:08 PM  Result Value Ref Range   Color, Urine YELLOW YELLOW   APPearance HAZY (A) CLEAR   Specific Gravity, Urine 1.023 1.005 - 1.030   pH 5.0 5.0 - 8.0   Glucose, UA NEGATIVE NEGATIVE mg/dL   Hgb urine dipstick SMALL (A) NEGATIVE   Bilirubin Urine NEGATIVE NEGATIVE   Ketones, ur NEGATIVE NEGATIVE mg/dL   Protein, ur NEGATIVE NEGATIVE mg/dL   Nitrite NEGATIVE NEGATIVE   Leukocytes, UA LARGE (A) NEGATIVE   RBC / HPF 0-5 0 - 5 RBC/hpf   WBC, UA >50 (H) 0 - 5 WBC/hpf   Bacteria, UA RARE (A) NONE SEEN   Squamous Epithelial / LPF 6-10 0 - 5   Mucus PRESENT     Comment: Performed at [facility], [address] [place], [place] [address]  Pregnancy, urine POC     Status: None   Collection Time: 09/29/17  1:29 PM  Result Value Ref Range   Preg Test, Ur NEGATIVE NEGATIVE    Comment:        THE SENSITIVITY OF THIS METHODOLOGY IS >24 mIU/mL   CBC with Differential     Status: Abnormal   Collection Time: 09/29/17  2:31 PM  Result Value Ref Range   WBC 14.7 (H) 4.0 - 10.5 K/uL   RBC 4.40 3.87 - 5.11 MIL/uL   Hemoglobin 12.7 12.0 - 15.0 g/dL   HCT 37.6 36.0 - 46.0 %   MCV 85.5 78.0 - 100.0 fL   MCH 28.9 26.0 - 34.0 pg   MCHC 33.8 30.0 - 36.0 g/dL   RDW 13.4 11.5 - 15.5 %   Platelets 282 150 - 400 K/uL   Neutrophils Relative % 82 %   Neutro Abs 12.1 (H) 1.7 - 7.7 K/uL   Lymphocytes Relative 14 %   Lymphs Abs 2.1 0.7 - 4.0 K/uL   Monocytes Relative 2 %   Monocytes Absolute 0.3 0.1 - 1.0 K/uL   Eosinophils Relative 2 %   Eosinophils Absolute 0.3 0.0 - 0.7 K/uL   Basophils Relative 0 %   Basophils Absolute 0.0 0.0 - 0.1 K/uL    Comment: Performed at [facility], [address] [place], [place] [address]   Ct Abdomen Pelvis W Contrast  Result Date: 09/29/2017 CLINICAL DATA:  Right-sided abdominal pain since early this morning. EXAM: CT ABDOMEN AND PELVIS WITH CONTRAST TECHNIQUE: Multidetector CT  imaging of the abdomen and pelvis was performed using the standard protocol following bolus administration of intravenous contrast. CONTRAST:  158mL ISOVUE-300 IOPAMIDOL (ISOVUE-300) INJECTION 61% COMPARISON:  None. FINDINGS: Lower chest: The lung bases are clear of acute process. No pleural effusion or pulmonary lesions. The heart is normal in size. No pericardial effusion. The distal esophagus and aorta are unremarkable. Hepatobiliary: No focal hepatic lesions or intrahepatic biliary dilatation. The gallbladder is normal. No common bile duct dilatation. Pancreas: No mass, inflammation or ductal dilatation. Spleen: Normal size.  No focal lesions. Adrenals/Urinary Tract: The adrenal glands and kidneys are unremarkable. No renal, ureteral or bladder calculi or mass. Stomach/Bowel: The stomach, duodenum, small bowel and colon are unremarkable. No acute inflammatory changes, mass lesions or obstructive findings. The terminal ileum is normal. The appendix is abnormal. It is thickened, edematous and demonstrates surrounding inflammatory change. Maximum diameter is 12 mm. No definite appendicoliths. No findings to suggest ruptured appendicitis. Vascular/Lymphatic: The aorta is normal in caliber. No dissection. The branch vessels are patent. The major venous structures are patent. No mesenteric or retroperitoneal mass or adenopathy. Small scattered lymph nodes are noted. Reproductive: Large right-sided uterine fibroid. Both ovaries appear normal. Other: Small amount of free pelvic fluid noted. No pelvic adenopathy. No inguinal adenopathy. Musculoskeletal: No significant bony findings. IMPRESSION: 1. CT findings consistent with acute appendicitis. No appendicoliths or evidence of rupture. 2. Uterine fibroids. Electronically Signed   By: Marijo Sanes M.D.   On: 09/29/2017 16:18   Review of Systems  Constitutional: Positive for fatigue. Negative for fever.  Gastrointestinal: Positive for abdominal pain (Pressure) and  nausea. Negative for vomiting.  Genitourinary: Positive for urgency. Negative for difficulty urinating, dysuria, flank pain and frequency.  Musculoskeletal: Negative for back pain.   Physical Exam   Blood pressure (!) 133/94, pulse (!) 110, temperature 98.6 F (37 C), resp. rate 16, height 5\' 2"  (1.575 m), weight 161 lb (73 kg), last menstrual period 08/22/2017.  Physical Exam  Constitutional: She is oriented to person, place,  and time. She appears well-developed and well-nourished. No distress.  HENT:  Head: Normocephalic.  Eyes: Pupils are equal, round, and reactive to light.  Respiratory: Effort normal.  GI: Soft. Normal appearance. There is tenderness in the right lower quadrant and suprapubic area. There is rebound. There is no rigidity, no guarding and no CVA tenderness.  Musculoskeletal: Normal range of motion.  Neurological: She is alert and oriented to person, place, and time.  Skin: Skin is warm. She is not diaphoretic.  Psychiatric: Her behavior is normal.   MAU Course  Procedures  None  MDM  Patient declined pain medication Patient was inquiring about recent labs that were drawn in the office, discussed TSH results with the patient. Patient made note that the level was on the higher end. Patient started crying stating they are frustrated and just want answerers as to why they keep losing pregnancies. I offered support and recommended they have this discussion with GYN next month in July.  Free T3 and T4 added  WBC 3/14: 6.5 today WBC 17.4 with left shift differential. CT ordered  Discussed patient with General surgery Dr. Dema Severin who recommends the patient go to [facility] ED. Spoke to Dr. Alvino Chapel from [facility] ED who accepts the patient's transfer for an acute appendicitis.   Assessment and Plan   A:  1. Other acute appendicitis     P:  Transfer to [facility] ED per Dr. Dema Severin with General surgery Patient was informed and all questions answered Patient is stable for  transfer.    Noni Saupe I, NP 09/29/2017 5:08 PM

## 2017-09-29 NOTE — Transfer of Care (Signed)
Immediate Anesthesia Transfer of Care Note  Patient: April Weiss  Procedure(s) Performed: APPENDECTOMY LAPAROSCOPIC (N/A Abdomen)  Patient Location: PACU  Anesthesia Type:General  Level of Consciousness: awake, alert , oriented and patient cooperative  Airway & Oxygen Therapy: Patient Spontanous Breathing and Patient connected to face mask  Post-op Assessment: Report given to RN and Post -op Vital signs reviewed and stable  Post vital signs: Reviewed and stable  Last Vitals:  Vitals Value Taken Time  BP 112/71 09/29/2017  8:21 PM  Temp    Pulse 102 09/29/2017  8:23 PM  Resp    SpO2 95 % 09/29/2017  8:23 PM  Vitals shown include unvalidated device data.  Last Pain:  Vitals:   09/29/17 1835  TempSrc: Oral  PainSc:          Complications: No apparent anesthesia complications

## 2017-09-29 NOTE — Anesthesia Preprocedure Evaluation (Signed)
Anesthesia Evaluation  Patient identified by MRN, date of birth, ID band Patient awake    Reviewed: Allergy & Precautions, H&P , NPO status , Patient's Chart, lab work & pertinent test results, reviewed documented beta blocker date and time   Airway Mallampati: II  TM Distance: >3 FB Neck ROM: full    Dental no notable dental hx.    Pulmonary neg pulmonary ROS,    Pulmonary exam normal breath sounds clear to auscultation       Cardiovascular Exercise Tolerance: Good negative cardio ROS   Rhythm:regular Rate:Normal     Neuro/Psych negative neurological ROS  negative psych ROS   GI/Hepatic negative GI ROS, Neg liver ROS,   Endo/Other  negative endocrine ROS  Renal/GU negative Renal ROS  negative genitourinary   Musculoskeletal   Abdominal   Peds  Hematology negative hematology ROS (+)   Anesthesia Other Findings   Reproductive/Obstetrics negative OB ROS                             Anesthesia Physical Anesthesia Plan  ASA: II and emergent  Anesthesia Plan: General   Post-op Pain Management:    Induction: Intravenous and Cricoid pressure planned  PONV Risk Score and Plan: 3 and Ondansetron, Treatment may vary due to age or medical condition, Propofol infusion and Midazolam  Airway Management Planned: Oral ETT  Additional Equipment:   Intra-op Plan:   Post-operative Plan: Extubation in OR  Informed Consent: I have reviewed the patients History and Physical, chart, labs and discussed the procedure including the risks, benefits and alternatives for the proposed anesthesia with the patient or authorized representative who has indicated his/her understanding and acceptance.   Dental Advisory Given  Plan Discussed with: CRNA, Anesthesiologist and Surgeon  Anesthesia Plan Comments: (  )        Anesthesia Quick Evaluation

## 2017-09-29 NOTE — MAU Note (Signed)
[facility] contacted, Pt report given to [place] dispatcher for [employer] [facility] [place] ED, gave pt report to Erline Levine, Warehouse manager.

## 2017-09-29 NOTE — Op Note (Addendum)
April Weiss [id number]   PRE-OPERATIVE DIAGNOSIS:  acute appendicitis  POST-OPERATIVE DIAGNOSIS:  acute nonperforated appendicitis  Procedure: Laparoscopic appendectomy  SURGEON:  Sharon Mt. Violette Morneault, M.D.  ASSISTANT: Scrub nurse  ANESTHESIA: General endotracheal  EBL:   5cc  DRAINS: None  SPECIMEN:  Appendix  COUNTS:  Sponge, needle and instrument counts were reported correct x2 at conclusion of the operation  DISPOSITION:  PACU in satisfactory condition  COMPLICATIONS: None  FINDINGS: Findings consistent with acute appendicitis involving the tip of the appendix.  Appendix was retrocecal which necessitated mobilization of the cecum and proximal ascending colon.  Uterus was somewhat enlarged consistent with her known history of fibroids.  Both tubes and ovaries appeared normal  INDICATIONS: April Weiss is an 32 y.o. female with no known past medical history presented to [facility] earlier today with acute onset of right sided abdominal pain. Pain started when she woke this morning and was sharp/achy. She has never had this before. Pain did not radiate. She has had associated chills but no fever. Had loss of appetite as well. Nausea, but no emesis.  She presented to [facility] at the direction of her gynecologist for further evaluation.  Work-up there was notable for right lower quadrant tenderness and a WBC of 14.7 resulting in a CAT scan of the abdomen and pelvis.  This demonstrated findings consistent with acute appendicitis with a thickened, edematous appendix with surrounding inflammatory change.  Maximum diameter is 12 mm.  No appendicoliths or findings suggestive of rupture were identified.  She was subsequently transferred to the [facility] long emergency room for further evaluation.  In the ER, she did have right lower quadrant tenderness on exam.  She did not have tenderness elsewhere in her abdomen.  Given all the above, options were discussed.  Please refer to H&P  for details regarding all of this discussion.  She opted to undergo laparoscopic vs open  appendectomy.   DESCRIPTION:   The patient was identified & brought into the operating room. SCDs were in place and functioning. General endotracheal anesthesia was administered. Preoperative antibiotics were administered. The patient was positioned supine with left arm tucked. A foley catheter was inserted under sterile conditions.  Hair on the abdomen and suprapubic region was clipped.  The abdomen was prepped and draped in the standard sterile fashion. A surgical timeout confirmed our plan.  A small vertical incision was made in the infraumbilical location. The subcutaneous tissue was dissected and the umbilical stalk identified. The stalk was grasped with a Kocher and retracted outwardly. The infraumbilical fascia was exposed and incised. Peritoneal entry was carefully made bluntly. An 0 Vicryl purse-string suture was placed and then the [facility] port was introduced into the abdomen.  CO2 insufflation commenced to 77mmHg. The laparoscope was inserted and confirmed no evidence of injury. The patient was then positioned in Trendelenburg. Two additional 58mm ports were placed - one in left lower quadrant and another in the suprapubic midline 3 fingerbreadths above the pubic symphysis, with care taken to the entry to stay well above the bladder. The patient was then placed in the left side down position.  The terminal ileum was gently mobilized to proximal ascending colon in a lateral to medial fashion. Care was taken to avoid injuring any retroperitoneal structures.  The appendix was in a retrocecal location.  The proximal ascending colon was also mobilized along the Loye Vento line of Toldt.  At this point, the appendix was readily identified. The attachments to the appendix from the ascending  colon and cecal mesentery were freed.  The appendix was elevated.  The base of the appendix was circumferentially dissected.  In order  to facilitate stapler division of the appendix, the mesoappendix was subsequently ligated using a harmonic scalpel.  The base of the appendix was noted to be viable and healthy appearing.  The appendix was then stapled with a blue load, taking a tiny cuff of cecum, with additional care taken to ensure that there is no narrowing of the ileocecal valve. The appendix was placed in an EndoBag and extracted through the umbilical port site.  The right lower quadrant was then revisualized.  Hemostasis was noted to be achieved - taking time to inspect the ligated mesoappendix, colon mesentery, and retroperitoneum. Staple line was noted to be intact on the cecum with no bleeding. There was no perforation or injury. The right lower quadrant appeared clean and as such, no drain was placed.  The left lower quadrant and suprapubic ports were removed under direct visualization. The CO2 was exhausted from the abdomen. The umbilical fascia was then closed usin the 0 Vicryl suture.  The incision site and the fascia was palpated and noted to be completely closed.  The skin of all port sites was then approximated using 4-0 Monocryl suture. The incisions were dressed with Dermabond.  The patient was then extubated and transferred to a stretcher for transport to recover in satisfactory condition having tolerated the procedure well

## 2017-09-29 NOTE — Telephone Encounter (Signed)
Patient called back complaining of urinary frequency and stating that her cramping has gotten intense.  Patient states that she has never had this before and has been using a heating pad.  Recommend that if the pain is this bad to go to [facility] for evaluation. They could do a rapid HCG and ultrasound if needed.     Patient states she will think about it and probably go. Kathrene Alu RN

## 2017-09-29 NOTE — ED Triage Notes (Signed)
[facility]/o RLQ abdominal pain.  20 g RAC IV   BP: 125/85 HR: 95 SPO2: 95% RR: 16

## 2017-09-30 ENCOUNTER — Encounter (HOSPITAL_COMMUNITY): Payer: Self-pay | Admitting: Surgery

## 2017-09-30 LAB — BASIC METABOLIC PANEL
ANION GAP: 8 (ref 5–15)
BUN: 11 mg/dL (ref 6–20)
CALCIUM: 9.5 mg/dL (ref 8.9–10.3)
CO2: 27 mmol/L (ref 22–32)
Chloride: 106 mmol/L (ref 101–111)
Creatinine, Ser: 0.62 mg/dL (ref 0.44–1.00)
GLUCOSE: 138 mg/dL — AB (ref 65–99)
POTASSIUM: 4.3 mmol/L (ref 3.5–5.1)
SODIUM: 141 mmol/L (ref 135–145)

## 2017-09-30 LAB — CBC
HCT: 36.6 % (ref 36.0–46.0)
HEMOGLOBIN: 12.1 g/dL (ref 12.0–15.0)
MCH: 28.7 pg (ref 26.0–34.0)
MCHC: 33.1 g/dL (ref 30.0–36.0)
MCV: 86.9 fL (ref 78.0–100.0)
Platelets: 268 10*3/uL (ref 150–400)
RBC: 4.21 MIL/uL (ref 3.87–5.11)
RDW: 13.1 % (ref 11.5–15.5)
WBC: 11.3 10*3/uL — AB (ref 4.0–10.5)

## 2017-09-30 LAB — MAGNESIUM: MAGNESIUM: 1.7 mg/dL (ref 1.7–2.4)

## 2017-09-30 LAB — PHOSPHORUS: PHOSPHORUS: 3.1 mg/dL (ref 2.5–4.6)

## 2017-09-30 LAB — T3, FREE: T3, Free: 3.3 pg/mL (ref 2.0–4.4)

## 2017-09-30 MED ORDER — FENTANYL CITRATE (PF) 100 MCG/2ML IJ SOLN
INTRAMUSCULAR | Status: AC
  Filled 2017-09-30: qty 2

## 2017-09-30 MED ORDER — TRAMADOL HCL 50 MG PO TABS: 50.0000 mg | ORAL_TABLET | Freq: Four times a day (QID) | ORAL | 0 refills | Status: AC | PRN

## 2017-09-30 MED ORDER — PROPOFOL 10 MG/ML IV BOLUS
INTRAVENOUS | Status: AC
  Filled 2017-09-30: qty 20

## 2017-09-30 NOTE — Discharge Summary (Signed)
[facility] Discharge Summary   Patient ID: April Weiss MRN: [id number] DOB/AGE: 08-25-1985 32 y.o.  Admit date: 09/29/2017 Discharge date: 09/30/2017  Discharge Diagnosis Patient Active Problem List   Diagnosis Date Noted  . Acute appendicitis 09/29/2017   Imaging: Ct Abdomen Pelvis W Contrast  Result Date: 09/29/2017 CLINICAL DATA:  Right-sided abdominal pain since early this morning. EXAM: CT ABDOMEN AND PELVIS WITH CONTRAST TECHNIQUE: Multidetector CT imaging of the abdomen and pelvis was performed using the standard protocol following bolus administration of intravenous contrast. CONTRAST:  130mL ISOVUE-300 IOPAMIDOL (ISOVUE-300) INJECTION 61% COMPARISON:  None. FINDINGS: Lower chest: The lung bases are clear of acute process. No pleural effusion or pulmonary lesions. The heart is normal in size. No pericardial effusion. The distal esophagus and aorta are unremarkable. Hepatobiliary: No focal hepatic lesions or intrahepatic biliary dilatation. The gallbladder is normal. No common bile duct dilatation. Pancreas: No mass, inflammation or ductal dilatation. Spleen: Normal size.  No focal lesions. Adrenals/Urinary Tract: The adrenal glands and kidneys are unremarkable. No renal, ureteral or bladder calculi or mass. Stomach/Bowel: The stomach, duodenum, small bowel and colon are unremarkable. No acute inflammatory changes, mass lesions or obstructive findings. The terminal ileum is normal. The appendix is abnormal. It is thickened, edematous and demonstrates surrounding inflammatory change. Maximum diameter is 12 mm. No definite appendicoliths. No findings to suggest ruptured appendicitis. Vascular/Lymphatic: The aorta is normal in caliber. No dissection. The branch vessels are patent. The major venous structures are patent. No mesenteric or retroperitoneal mass or adenopathy. Small scattered lymph nodes are noted. Reproductive: Large right-sided uterine fibroid. Both ovaries appear  normal. Other: Small amount of free pelvic fluid noted. No pelvic adenopathy. No inguinal adenopathy. Musculoskeletal: No significant bony findings. IMPRESSION: 1. CT findings consistent with acute appendicitis. No appendicoliths or evidence of rupture. 2. Uterine fibroids. Electronically Signed   By: Marijo Sanes M.D.   On: 09/29/2017 16:18    Procedures Dr. Nadeen Landau (09/29/17) - Laparoscopic Appendectomy  Hospital Course:  32 y/o F who presented to [facility] with right sided abd pain.  Workup showed WBC 14,700 and CT scan consistent with acute, non-perforated appendicitis .  Patient was admitted and underwent procedure listed above.  Tolerated procedure well and was transferred to the floor.  Diet was advanced as tolerated.  On POD#1, the patient was voiding well, tolerating diet, ambulating well, pain well controlled, vital signs stable, incisions c/d/i and felt stable for discharge home.  Patient will follow up in our office in 2-3 weeks and knows to call with questions or concerns.  She will call to confirm appointment date/time.    Allergies as of 09/30/2017      Reactions   Penicillins Hives   Has patient had a PCN reaction causing immediate rash, facial/tongue/throat swelling, SOB or lightheadedness with hypotension: Yes Has patient had a PCN reaction causing severe rash involving mucus membranes or skin necrosis: No Has patient had a PCN reaction that required hospitalization: No Has patient had a PCN reaction occurring within the last 10 years: No If all of the above answers are "NO", then may proceed with Cephalosporin use.      Medication List    STOP taking these medications   ibuprofen 200 MG tablet Commonly known as:  ADVIL,MOTRIN     TAKE these medications   prenatal vitamin w/FE, FA 27-1 MG Tabs tablet Take 1 tablet by mouth daily at 12 noon.   traMADol 50 MG tablet Commonly known as:  ULTRAM Take 1  tablet (50 mg total) by mouth every 6 (six) hours as needed for up  to 5 days (acute postop pain not controlled with ibuprofen and tylenol).        Follow-up [facility] Follow up.   Specialty:  General Surgery Why:  call to confirm post-operative follow up appointment date/time.  Contact information: [address] [place] [phone]          Signed: Obie Dredge, [facility] 09/30/2017, 1:32 PM Pager: [phone] Consults: [phone] Mon-Fri 7:00 am-4:30 pm Sat-Sun 7:00 am-11:30 am

## 2017-09-30 NOTE — Anesthesia Postprocedure Evaluation (Signed)
Anesthesia Post Note  Patient: April Weiss  Procedure(s) Performed: APPENDECTOMY LAPAROSCOPIC (N/A Abdomen)     Patient location during evaluation: PACU Anesthesia Type: General Level of consciousness: awake and alert Pain management: pain level controlled Vital Signs Assessment: post-procedure vital signs reviewed and stable Respiratory status: spontaneous breathing, nonlabored ventilation, respiratory function stable and patient connected to nasal cannula oxygen Cardiovascular status: blood pressure returned to baseline and stable Postop Assessment: no apparent nausea or vomiting Anesthetic complications: no    Last Vitals:  Vitals:   09/29/17 2303 09/30/17 0001  BP: 107/72 106/78  Pulse: 88 87  Resp: 14 15  Temp: 36.8 C 36.7 C  SpO2: 99% 95%    Last Pain:  Vitals:   09/30/17 0005  TempSrc:   PainSc: Asleep                 [name]

## 2017-09-30 NOTE — Progress Notes (Signed)
Subjective No acute events. Feeling well, sore, but no other complaints. Tolerating liquids without n/v. Ambulating. Voiding multiple times. Passing flatus.  Objective: Vital signs in last 24 hours: Temp:  [97.4 F (36.3 C)-98.9 F (37.2 C)] 97.4 F (36.3 C) (06/18 0432) Pulse Rate:  [82-110] 84 (06/18 0432) Resp:  [12-16] 14 (06/18 0432) BP: (103-135)/(63-94) 108/79 (06/18 0432) SpO2:  [92 %-100 %] 97 % (06/18 0432) Weight:  [73 kg (161 lb)] 73 kg (161 lb) (06/17 2134) Last BM Date: 09/29/17  Intake/Output from previous day: 06/17 0701 - 06/18 0700 In: 1537.5 [I.V.:1537.5] Out: 1660 [Urine:1650; Blood:10] Intake/Output this shift: No intake/output data recorded.  Gen: NAD, comfortable CV: RRR Pulm: Normal work of breathing Abd: Soft, NT/ND; incisions c/d/i with ecchymosis around umbilical incision, no blanching erythema. No drainage. Ext: SCDs in place  Lab Results: CBC  Recent Labs    09/29/17 1431 09/30/17 0423  WBC 14.7* 11.3*  HGB 12.7 12.1  HCT 37.6 36.6  PLT 282 268   BMET Recent Labs    09/29/17 1754 09/30/17 0423  NA 137 141  K 3.6 4.3  CL 104 106  CO2 26 27  GLUCOSE 93 138*  BUN 13 11  CREATININE 0.55 0.62  CALCIUM 9.1 9.5   PT/INR No results for input(s): LABPROT, INR in the last 72 hours. ABG No results for input(s): PHART, HCO3 in the last 72 hours.  Invalid input(s): PCO2, PO2  Studies/Results:  Anti-infectives: Anti-infectives (From admission, onward)   Start     Dose/Rate Route Frequency Ordered Stop   09/29/17 1849  ciprofloxacin (CIPRO) 400 MG/200ML IVPB    Note to Pharmacy:  Dellie Catholic   : cabinet override      09/29/17 1849 09/29/17 1855   09/29/17 1849  metroNIDAZOLE (FLAGYL) 5-0.79 MG/ML-% IVPB    Note to Pharmacy:  Dellie Catholic   : cabinet override      09/29/17 1849 09/29/17 1925       Assessment/Plan: Patient Active Problem List   Diagnosis Date Noted  . Acute appendicitis 09/29/2017   s/p  Procedure(s): APPENDECTOMY LAPAROSCOPIC 09/29/2017  -Diet as tolerated -D/C IVF -Ambulate 5x/day -PPx: SQH, SCDs -Dispo: Possible discharge later today if tolerating diet, pain controlled on oral analgesics, feeling well   LOS: 0 days   Sharon Mt. Dema Severin, M.D. [facility]

## 2017-09-30 NOTE — Discharge Instructions (Signed)
POST OP INSTRUCTIONS ° °1. DIET: As tolerated. Follow a light bland diet the first 24 hours after arrival home, such as soup, liquids, crackers, etc.  Be sure to include lots of fluids daily.  Avoid fast food or heavy meals as your are more likely to get nauseated.  Eat a low fat the next few days after surgery. ° °2. Take your usually prescribed home medications unless otherwise directed. ° °3. PAIN CONTROL: °a. Pain is best controlled by a usual combination of three different methods TOGETHER: °i. Ice/Heat °ii. Over the counter pain medication °iii. Prescription pain medication °b. Most patients will experience some swelling and bruising around the surgical site.  Ice packs or heating pads (30-60 minutes up to 6 times a day) will help. Some people prefer to use ice alone, heat alone, alternating between ice & heat.  Experiment to what works for you.  Swelling and bruising can take several weeks to resolve.   °c. It is helpful to take an over-the-counter pain medication regularly for the first few weeks: °i. Ibuprofen (Motrin/Advil) - 200mg tabs - take 3 tabs (600mg) every 6 hours as needed for pain °ii. Acetaminophen (Tylenol) - you may take 650mg every 6 hours as needed. You can take this with motrin as they act differently on the body. If you are taking a narcotic pain medication that has acetaminophen in it, do not take over the counter tylenol at the same time. ° Iii. NOTE: You may take both of these medications together - most patients  find it most helpful when alternating between the two (i.e. Ibuprofen at 6am,  tylenol at 9am, ibuprofen at 12pm ...) °d. A  prescription for pain medication should be given to you upon discharge.  Take your pain medication as prescribed if your pain is not adequatly controlled with the over-the-counter pain reliefs mentioned above. ° °4. Avoid getting constipated.  Between the surgery and the pain medications, it is common to experience some constipation.  Increasing fluid  intake and taking a fiber supplement (such as Metamucil, Citrucel, FiberCon, MiraLax, etc) 1-2 times a day regularly will usually help prevent this problem from occurring.  A mild laxative (prune juice, Milk of Magnesia, MiraLax, etc) should be taken according to package directions if there are no bowel movements after 48 hours.   ° °5. Dressing: Your incisions are covered in Dermabond which is like sterile superglue for the skin. This will come off on it's own in a couple weeks. It is waterproof and you may bathe normally starting the day after your surgery in a shower. Avoid baths/pools/lakes/oceans until your wounds have fully healed. ° °6. ACTIVITIES as tolerated:   °a. Avoid heavy lifting (>10lbs or 1 gallon of milk) for the next 6 weeks. °b. You may resume regular (light) daily activities beginning the next day--such as daily self-care, walking, climbing stairs--gradually increasing activities as tolerated.  If you can walk 30 minutes without difficulty, it is safe to try more intense activity such as jogging, treadmill, bicycling, low-impact aerobics.  °c. DO NOT PUSH THROUGH PAIN.  Let pain be your guide: If it hurts to do something, don't do it. °d. You may drive when you are no longer taking prescription pain medication, you can comfortably wear a seatbelt, and you can safely maneuver your car and apply brakes. °e. You may have sexual intercourse when it is comfortable.  ° °7. FOLLOW UP in our office °a. Please call [facility] at [phone] to set up an appointment   to see your surgeon in the office for a follow-up appointment approximately 2 weeks after your surgery. °b. Make sure that you call for this appointment the day you arrive home to insure a convenient appointment time. ° °9. If you have disability or family leave forms that need to be completed, you may have them completed by your primary care physician's office; for return to work instructions, please ask our office staff and they will be happy to  assist you in obtaining this documentation °  °When to call us [phone]: °1. Poor pain control °2. Reactions / problems with new medications (rash/itching, etc)  °3. Fever over 101.5 F (38.5 C) °4. Inability to urinate °5. Nausea/vomiting °6. Worsening swelling or bruising °7. Continued bleeding from incision. °8. Increased pain, redness, or drainage from the incision ° °The clinic staff is available to answer your questions during regular business hours (8:30am-5pm).  Please don’t hesitate to call and ask to speak to one of our nurses for clinical concerns.   A surgeon from [facility] is always on call at the hospitals °  °If you have a medical emergency, go to the nearest emergency room or call 911. ° °[facility] °[address], [address], [place], [place]  [address] °MAIN: [phone] °FAX: [phone] °[email or web address] ° ° °

## 2017-09-30 NOTE — Progress Notes (Signed)
Pt alert, oriented, tolerating diet.  D/C instructions and presciption was given, all questions answered. D/cd home with husband.

## 2017-10-15 ENCOUNTER — Encounter: Payer: Self-pay | Admitting: Obstetrics & Gynecology

## 2017-10-15 ENCOUNTER — Ambulatory Visit (INDEPENDENT_AMBULATORY_CARE_PROVIDER_SITE_OTHER): Payer: Managed Care, Other (non HMO) | Admitting: Obstetrics & Gynecology

## 2017-10-15 VITALS — BP 109/76 | HR 96 | Resp 18 | Wt 165.0 lb

## 2017-10-15 DIAGNOSIS — N951 Menopausal and female climacteric states: Secondary | ICD-10-CM | POA: Diagnosis not present

## 2017-10-15 DIAGNOSIS — N914 Secondary oligomenorrhea: Secondary | ICD-10-CM | POA: Diagnosis not present

## 2017-10-15 NOTE — Progress Notes (Signed)
History:  32 y.o. G3P0030 here today for LMP 5/9-01/2018 for review of labs. She reports that since her last visit she was admitted for acute appy. She is s/p appendectomy. She reports that she feels better. She is not sure if she is pregnancy as se has not had menses since her ED visit. She is here today with her husband who reports that he has a low sperm count.    Pt reports that she is concerned about possible early menopause as she is having hot flushes.      The following portions of the patient's history were reviewed and updated as appropriate: allergies, current medications, past family history, past medical history, past social history, past surgical history and problem list.  Review of Systems:  Pertinent items are noted in HPI.    Objective:  Physical Exam Blood pressure 109/76, pulse 96, resp. rate 18, weight 165 lb (74.8 kg), last menstrual period 08/21/2017.  CONSTITUTIONAL: Well-developed, well-nourished female in no acute distress.  HENT:  Normocephalic, atraumatic EYES: Conjunctivae and EOM are normal. No scleral icterus.  NECK: Normal range of motion SKIN: Skin is warm and dry. No rash noted. Not diaphoretic.No pallor. [place]: Alert and oriented to person, place, and time. Normal coordination.   Labs and Imaging US Transvaginal Non-ob  Result Date: 09/21/2017 CLINICAL DATA:  Initial evaluation for enlarged uterus, history of fibroids, ovarian cyst. EXAM: TRANSABDOMINAL AND TRANSVAGINAL ULTRASOUND OF PELVIS TECHNIQUE: Both transabdominal and transvaginal ultrasound examinations of the pelvis were performed. Transabdominal technique was performed for global imaging of the pelvis including uterus, ovaries, adnexal regions, and pelvic cul-de-sac. It was necessary to proceed with endovaginal exam following the transabdominal exam to visualize the uterus, endometrium, and ovaries. COMPARISON:  None FINDINGS: Uterus Measurements: 8.5 x 4.4 x 5.0 cm. 4.1 x 3.1 x 3.0 cm intramural  fibroid present at the right posterior mid uterine body. 1.4 x 1.0 x 1.3 cm intramural fibroid present at the anterior uterine fundus. Endometrium Thickness: 8 mm. No focal abnormality visualized. 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 Right ovary Not visualized.  No adnexal mass. Left ovary Measurements: 2.4 x 1.9 x 2.1 cm. Normal appearance/no adnexal mass. Other findings Trace free physiologic fluid within the pelvis. IMPRESSION: 1. Fibroid uterus as above. 2. Nonvisualization of the right ovary.  No adnexal mass. 3. Normal left ovary. Electronically Signed   By: Jeannine Boga M.D.   On: 09/21/2017 02:15   US Pelvis Complete  Result Date: 09/21/2017 CLINICAL DATA:  Initial evaluation for enlarged uterus, history of fibroids, ovarian cyst. EXAM: TRANSABDOMINAL AND TRANSVAGINAL ULTRASOUND OF PELVIS TECHNIQUE: Both transabdominal and transvaginal ultrasound examinations of the pelvis were performed. Transabdominal technique was performed for global imaging of the pelvis including uterus, ovaries, adnexal regions, and pelvic cul-de-sac. It was necessary to proceed with endovaginal exam following the transabdominal exam to visualize the uterus, endometrium, and ovaries. COMPARISON:  None FINDINGS: Uterus Measurements: 8.5 x 4.4 x 5.0 cm. 4.1 x 3.1 x 3.0 cm intramural fibroid present at the right posterior mid uterine body. 1.4 x 1.0 x 1.3 cm intramural fibroid present at the anterior uterine fundus. Endometrium Thickness: 8 mm. No focal abnormality visualized. 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 Right ovary Not visualized.  No adnexal mass. Left ovary Measurements: 2.4 x 1.9 x 2.1 cm. Normal appearance/no adnexal mass. Other findings Trace free physiologic fluid within the pelvis. IMPRESSION: 1. Fibroid uterus as above. 2. Nonvisualization of the right ovary.  No adnexal mass. 3. Normal left ovary. Electronically Signed   By:  Jeannine Boga M.D.   On: 09/21/2017 02:15   Ct Abdomen Pelvis W  Contrast  Result Date: 09/29/2017 CLINICAL DATA:  Right-sided abdominal pain since early this morning. EXAM: CT ABDOMEN AND PELVIS WITH CONTRAST TECHNIQUE: Multidetector CT imaging of the abdomen and pelvis was performed using the standard protocol following bolus administration of intravenous contrast. CONTRAST:  150mL ISOVUE-300 IOPAMIDOL (ISOVUE-300) INJECTION 61% COMPARISON:  None. FINDINGS: Lower chest: The lung bases are clear of acute process. No pleural effusion or pulmonary lesions. The heart is normal in size. No pericardial effusion. The distal esophagus and aorta are unremarkable. Hepatobiliary: No focal hepatic lesions or intrahepatic biliary dilatation. The gallbladder is normal. No common bile duct dilatation. Pancreas: No mass, inflammation or ductal dilatation. Spleen: Normal size.  No focal lesions. Adrenals/Urinary Tract: The adrenal glands and kidneys are unremarkable. No renal, ureteral or bladder calculi or mass. Stomach/Bowel: The stomach, duodenum, small bowel and colon are unremarkable. No acute inflammatory changes, mass lesions or obstructive findings. The terminal ileum is normal. The appendix is abnormal. It is thickened, edematous and demonstrates surrounding inflammatory change. Maximum diameter is 12 mm. No definite appendicoliths. No findings to suggest ruptured appendicitis. Vascular/Lymphatic: The aorta is normal in caliber. No dissection. The branch vessels are patent. The major venous structures are patent. No mesenteric or retroperitoneal mass or adenopathy. Small scattered lymph nodes are noted. Reproductive: Large right-sided uterine fibroid. Both ovaries appear normal. Other: Small amount of free pelvic fluid noted. No pelvic adenopathy. No inguinal adenopathy. Musculoskeletal: No significant bony findings. IMPRESSION: 1. CT findings consistent with acute appendicitis. No appendicoliths or evidence of rupture. 2. Uterine fibroids. Electronically Signed   By: Marijo Sanes  M.D.   On: 09/29/2017 16:18   UPT today: neg  Assessment & Plan:  Recurrent pregnancy loss- all labs WNL. Her [place] was not resulted (the lab was called. The order was there but, it was not run)  I had a discussion with the pt and her husband about their desire to be pregnancy. I reviewed their options including clomid or letrozole, referral to REI with possible intrauterine insemination or IVF. They report that they have seen an 'infertility specialist' and they are not sure if they want to return. The pt reports that she was prev on clomid (she thinks for 1 cycle) she is not sure that she wants to restart that at present. We discussed possible progestin at the onset of any spontaneous pregnancy. We also discussed the small fibroids that she has. I think it is unlikely that this is the cause of her sx but, we reviewed management options for the fibroids if needed.   Total face-to-face time with patient was 35 min.  Greater than 50% was spent in counseling and coordination of care with the patient as above.  Will obtain and review records from prev REI and OB/GYn when available.  Need to redraw lab for [facility] F/u in 6 weeks or sooner prn  Kenidi Elenbaas L. Harraway-Smith, M.D., [facility]

## 2017-10-15 NOTE — Patient Instructions (Signed)
Dr Feliberto Harts  [phone]

## 2017-10-16 LAB — FOLLICLE STIMULATING HORMONE: FSH: 57.1 m[IU]/mL

## 2017-10-22 ENCOUNTER — Telehealth: Payer: Self-pay

## 2017-10-22 NOTE — Telephone Encounter (Signed)
Pt called the office requesting results for her labs done on 10/15/17. Pt informed that labs were normal. Pt also requested results of urine sample. Pt informed that urine sample was not submitted probably because it didn't need to be ordered. Understanding was voiced.

## 2017-11-03 ENCOUNTER — Telehealth: Payer: Self-pay | Admitting: Obstetrics & Gynecology

## 2017-11-03 NOTE — Telephone Encounter (Signed)
TC to pt. Pt notified of an elevated [place]. She reports that her last cycle was 08/22/2017. She reports freq hot flushes. She reports that she does not know when her mother went though menopause. She wants to discuss this with her husband. I recommend that she goes to reproductive endocrinology for consultation about her options to conceive. Pt counseled to get a MyChart account activated as she wants to look at all of her labs. She asked to have them emailed. Pt reports that she will get her MyChart activated this week to view her labs. She was prev given the number for Dr. Domenic Polite ofc.   Trini Christiansen L. Harraway-Smith, M.D., [facility]

## 2018-04-21 ENCOUNTER — Encounter: Payer: Self-pay | Admitting: Advanced Practice Midwife

## 2018-04-21 ENCOUNTER — Ambulatory Visit (INDEPENDENT_AMBULATORY_CARE_PROVIDER_SITE_OTHER): Payer: Managed Care, Other (non HMO) | Admitting: Advanced Practice Midwife

## 2018-04-21 VITALS — BP 122/82 | HR 105 | Wt 173.0 lb

## 2018-04-21 DIAGNOSIS — Z3201 Encounter for pregnancy test, result positive: Secondary | ICD-10-CM | POA: Diagnosis not present

## 2018-04-21 DIAGNOSIS — O09891 Supervision of other high risk pregnancies, first trimester: Secondary | ICD-10-CM | POA: Diagnosis not present

## 2018-04-21 DIAGNOSIS — O3680X0 Pregnancy with inconclusive fetal viability, not applicable or unspecified: Secondary | ICD-10-CM

## 2018-04-21 DIAGNOSIS — Z23 Encounter for immunization: Secondary | ICD-10-CM | POA: Diagnosis not present

## 2018-04-21 DIAGNOSIS — O09899 Supervision of other high risk pregnancies, unspecified trimester: Secondary | ICD-10-CM | POA: Insufficient documentation

## 2018-04-21 NOTE — Patient Instructions (Signed)
Tests and Screening During Pregnancy Having certain tests and screenings during pregnancy is an important part of your prenatal care. These tests help your health care provider find problems that might affect your pregnancy. Some tests are done for all pregnant women, and some are optional. Most of the tests and screenings do not pose any risks for you or your baby. You may need additional testing if any routine tests indicate a problem. Tests and screenings done in early pregnancy Some tests and screenings you can expect to have in early pregnancy include:  Blood tests, such as: ? Complete blood count (CBC). This test is done to check your red and white blood cells. It can help identify a risk for anemia, infection, or bleeding. ? Blood typing. This test determines your blood type as well as whether you have a certain protein in your red blood cells (Rh factor). If you do not have this protein (Rh negative) and your baby does have it (Rh positive), your body could make antibodies to the Rh factor. This could be dangerous to your baby's health. ? Tests to check for diseases that can cause birth defects or can be passed to your baby, such as:  [place] measles (rubella). The test indicates whether you are immune to rubella.  Hepatitis B and C. All women are tested for hepatitis B. You may also be tested for hepatitis C if you have risk factors for the condition.  Zika virus infection. You may have a blood or urine test to check for this infection if you or your partner has traveled to an area where the virus occurs.  Urine testing. A urine sample can be tested for diabetes, protein in your urine, and signs of infection.  Testing for sexually transmitted infections (STIs), such as HIV, syphilis, and chlamydia.  Testing for tuberculosis. You may have this skin test if you are at risk for tuberculosis.  Fetal ultrasound. This is an imaging study of your developing baby. It is done using sound waves  and a computer. This test may be done at 11-14 weeks to confirm your pregnancy and help determine your due date. Tests and screenings done later in pregnancy Certain tests are done for the first time during later pregnancy. In addition, some of the tests that were done in early pregnancy are repeated at this time. Some common tests you can expect to have later in pregnancy include:  Rh antibody testing. If you are Rh negative, you will have a blood test at about 28 weeks of pregnancy to see if you are producing Rh antibodies. If you have not started to make antibodies, you will be given an injection to prevent you from making antibodies for the rest of your pregnancy.  Glucose screening. This tests your blood sugar to find out whether you are developing the type of diabetes that occurs during pregnancy (gestational diabetes). You may have this screening earlier if you have risk factors for diabetes.  Screening for group B streptococcus (GBS). GBS is a type of bacteria that may live in your rectum or vagina. You may have GBS without any symptoms. GBS can spread to your baby during birth. This test involves doing a rectal and vaginal swab at 35-37 weeks of pregnancy. If testing is positive for GBS, you may be treated with antibiotic medicine.  CBC to check for anemia and blood-clotting ability.  Urine tests to check for protein, which can be a sign of a condition called preeclampsia.  Fetal ultrasound. This  may be repeated at 16-20 weeks to check how your baby is growing and developing. Screening for birth defects Some birth defects are caused by abnormal genes passed down through families. Early in your pregnancy, tests can be done to find out if your baby is at risk for a genetic disorder. This testing is optional. The type of testing recommended for you will depend on your family and medical history, your ethnicity, and your age. Testing may include:  Screening tests. These tests may include an  ultrasound, blood tests, or a combination of both. The blood tests are used to check for abnormal genes, and the ultrasound is done to look for early birth defects.  Carrier screening. This test involves checking the blood or saliva of both parents to see if they carry abnormal genes that could be passed down to a baby. If genetic screening shows that your baby is at risk for a genetic defect, additional diagnostic testing may be recommended, such as:  Amniocentesis. This involves testing a sample of fluid from your womb (amniotic fluid).  Chorionic villus sampling. In this test, a sample of cells from your placenta is checked for abnormal cells. Unlike other tests done during pregnancy, diagnostic testing does have some risk for your pregnancy. Talk to your health care provider about the risks and benefits of genetic testing. Where to find more information  American Pregnancy Association: americanpregnancy.org/prenatal-testing  Office on Women's Health: KeywordPortfolios.com.br  March of Dimes: marchofdimes.org/pregnancy Questions to ask your health care provider  What routine tests are recommended for me?  When and how will these tests be done?  When will I get the results of routine tests?  What do the results of these tests mean for me or my baby?  Do you recommend any genetic screening tests? Which ones?  Should I see a genetic counselor before having genetic screening? Summary  Having tests and screenings during pregnancy is an important part of your prenatal care.  In early pregnancy, testing may be done to check blood type, Rh status, and risks for various conditions that can affect your baby.  Fetal ultrasound may be done in early pregnancy to confirm a pregnancy and later to look for any birth defects.  Later in pregnancy, tests may include screening for GBS and gestational diabetes.  Genetic testing is optional. Consider talking to a genetic counselor about this  testing. This information is not intended to replace advice given to you by your health care provider. Make sure you discuss any questions you have with your health care provider. Document Released: 06/16/2017 Document Revised: 06/16/2017 Document Reviewed: 06/16/2017 Elsevier Interactive Patient Education  2019 [place] of Pregnancy  The first trimester of pregnancy is from week 1 until the end of week 13 (months 1 through 3). During this time, your baby will begin to develop inside you. At 6-8 weeks, the eyes and face are formed, and the heartbeat can be seen on ultrasound. At the end of 12 weeks, all the baby's organs are formed. Prenatal care is all the medical care you receive before the birth of your baby. Make sure you get good prenatal care and follow all of your doctor's instructions. Follow these instructions at home: Medicines  Take over-the-counter and prescription medicines only as told by your doctor. Some medicines are safe and some medicines are not safe during pregnancy.  Take a prenatal vitamin that contains at least 600 micrograms (mcg) of folic acid.  If you have trouble pooping (constipation),  take medicine that will make your stool soft (stool softener) if your doctor approves. Eating and drinking   Eat regular, healthy meals.  Your doctor will tell you the amount of weight gain that is right for you.  Avoid raw meat and uncooked cheese.  If you feel sick to your stomach (nauseous) or throw up (vomit): ? Eat 4 or 5 small meals a day instead of 3 large meals. ? Try eating a few soda crackers. ? Drink liquids between meals instead of during meals.  To prevent constipation: ? Eat foods that are high in fiber, like fresh fruits and vegetables, whole grains, and beans. ? Drink enough fluids to keep your pee (urine) clear or pale yellow. Activity  Exercise only as told by your doctor. Stop exercising if you have cramps or pain in your lower  belly (abdomen) or low back.  Do not exercise if it is too hot, too humid, or if you are in a place of great height (high altitude).  Try to avoid standing for long periods of time. Move your legs often if you must stand in one place for a long time.  Avoid heavy lifting.  Wear low-heeled shoes. Sit and stand up straight.  You can have sex unless your doctor tells you not to. Relieving pain and discomfort  Wear a good support bra if your breasts are sore.  Take warm water baths (sitz baths) to soothe pain or discomfort caused by hemorrhoids. Use hemorrhoid cream if your doctor says it is okay.  Rest with your legs raised if you have leg cramps or low back pain.  If you have puffy, bulging veins (varicose veins) in your legs: ? Wear support hose or compression stockings as told by your doctor. ? Raise (elevate) your feet for 15 minutes, 3-4 times a day. ? Limit salt in your food. Prenatal care  Schedule your prenatal visits by the twelfth week of pregnancy.  Write down your questions. Take them to your prenatal visits.  Keep all your prenatal visits as told by your doctor. This is important. Safety  Wear your seat belt at all times when driving.  Make a list of emergency phone numbers. The list should include numbers for family, friends, the hospital, and police and fire departments. General instructions  Ask your doctor for a referral to a local prenatal class. Begin classes no later than at the start of month 6 of your pregnancy.  Ask for help if you need counseling or if you need help with nutrition. Your doctor can give you advice or tell you where to go for help.  Do not use hot tubs, steam rooms, or saunas.  Do not douche or use tampons or scented sanitary pads.  Do not cross your legs for long periods of time.  Avoid all herbs and alcohol. Avoid drugs that are not approved by your doctor.  Do not use any tobacco products, including cigarettes, chewing tobacco,  and electronic cigarettes. If you need help quitting, ask your doctor. You may get counseling or other support to help you quit.  Avoid cat litter boxes and soil used by cats. These carry germs that can cause birth defects in the baby and can cause a loss of your baby (miscarriage) or stillbirth.  Visit your dentist. At home, brush your teeth with a soft toothbrush. Be gentle when you floss. Contact a doctor if:  You are dizzy.  You have mild cramps or pressure in your lower belly.  You  have a nagging pain in your belly area.  You continue to feel sick to your stomach, you throw up, or you have watery poop (diarrhea).  You have a bad smelling fluid coming from your vagina.  You have pain when you pee (urinate).  You have increased puffiness (swelling) in your face, hands, legs, or ankles. Get help right away if:  You have a fever.  You are leaking fluid from your vagina.  You have spotting or bleeding from your vagina.  You have very bad belly cramping or pain.  You gain or lose weight rapidly.  You throw up blood. It may look like coffee grounds.  You are around people who have [place] measles, fifth disease, or chickenpox.  You have a very bad headache.  You have shortness of breath.  You have any kind of trauma, such as from a fall or a car accident. Summary  The first trimester of pregnancy is from week 1 until the end of week 13 (months 1 through 3).  To take care of yourself and your unborn baby, you will need to eat healthy meals, take medicines only if your doctor tells you to do so, and do activities that are safe for you and your baby.  Keep all follow-up visits as told by your doctor. This is important as your doctor will have to ensure that your baby is healthy and growing well. This information is not intended to replace advice given to you by your health care provider. Make sure you discuss any questions you have with your health care provider. Document  Released: 09/18/2007 Document Revised: 04/09/2016 Document Reviewed: 04/09/2016 Elsevier Interactive Patient Education  2019 [employer].

## 2018-04-21 NOTE — Progress Notes (Signed)
Subjective:    April Weiss is a [id number] [redacted]w[redacted]d being seen today for her first obstetrical visit.  Her obstetrical history is significant for advanced maternal age and Elevated FSH prior to conception, uterine fibroid, Reiters Dz, Obesity.   Patient does intend to breast feed. Pregnancy history fully reviewed.  Patient reports no bleeding and no cramping.  Never went to see Fertility MD.  Much desired pregnancy  Vitals:   04/21/18 0857  BP: 122/82  Pulse: (!) 105  Weight: 78.5 kg    HISTORY: OB History  Gravida Para Term Preterm AB Living  4       3    SAB TAB Ectopic Multiple Live Births  3            # Outcome Date GA Lbr Len/2nd Weight Sex Delivery Anes PTL Lv  4 Current           3 SAB 01/2017          2 SAB 2017          1 SAB 2014           Past Medical History:  Diagnosis Date  . Early puberty   . Estrogen deficiency   . Fibroid   . Headache   . Large uterus   . Reiter's disease [facility])    Past Surgical History:  Procedure Laterality Date  . LAPAROSCOPIC APPENDECTOMY N/A 09/29/2017   Procedure: APPENDECTOMY LAPAROSCOPIC;  Surgeon: Ileana Roup, MD;  Location: [facility];  Service: General;  Laterality: N/A;   Family History  Problem Relation Age of Onset  . Arthritis Father   . Hypertension Mother      Exam    Uterus:     Pelvic Exam:    Perineum: No Hemorrhoids   Vulva: Bartholin's, Urethra, Skene's normal   Vagina:  normal mucosa, normal discharge   pH:    Cervix: no cervical motion tenderness   Adnexa: no mass, fullness, tenderness  Difficult exam due to large fibroid uterus   Bony Pelvis: gynecoid  System: Breast:  normal appearance, no masses or tenderness   Skin: normal coloration and turgor, no rashes    Neurologic: oriented, grossly non-focal   Extremities: normal strength, tone, and muscle mass   HEENT neck supple with midline trachea   Mouth/Teeth mucous membranes moist, pharynx normal without lesions   Neck supple   Cardiovascular: regular rate and rhythm   Respiratory:  appears well, vitals normal, no respiratory distress, acyanotic, normal RR, ear and throat exam is normal, neck free of mass or lymphadenopathy   Abdomen: soft, non-tender; bowel sounds normal; no masses,  no organomegaly   Urinary: urethral meatus normal      Assessment:    Pregnancy: G4P0030 Patient Active Problem List   Diagnosis Date Noted  . Supervision of other high risk pregnancy, antepartum, unspecified trimester 04/21/2018  . Acute appendicitis 09/29/2017  . Overweight 03/01/2016        Plan:     Initial labs drawn, partial.  Will defer OB panel until we know how pregnancy is progressing Will check Quant HCG, CBC, BMET and ABO/Antibody today  US done today which did not show Gestational Sac or fetal pole.  Uterine Fibroid seen Discussed this can be related to early pregnancy, nonviable pregnancy or possibly ectopic.  Discussed we cannot predict with only one HCG value which way this will go.   Recommend repeat Quant HCG Thursday to look for progression  Prenatal vitamins. Problem list reviewed  and updated. Genetic Screening discussed Integrated Screen: Not discussed due to uncertain pregnancy viability.  Ultrasound discussed; fetal survey: Discussed.  .  Follow up in 2 days for repeat HCG test.  Discussed expected progression and followup.  Will retest in office Thursday so we can have result by Friday.    50% of 30 min visit spent on counseling and coordination of care.     Hansel Feinstein 04/21/2018

## 2018-04-21 NOTE — Progress Notes (Signed)
Bedside ultrasound reveals no fetal pole. Noted patients fibroid and normal appearing ovaries.  Patient informed that the ultrasound is considered a limited obstetric ultrasound and is not intended to be a complete ultrasound exam. Patient also informed that the ultrasound is not being completed with the intent of assessing for fetal or placental anomalies or any pelvic abnormalities. Explained that the purpose of today's ultrasound is to assess for fetus. Patient acknowledges the purpose of the exam and the limitations of the study.   Kathrene Alu RN

## 2018-04-22 LAB — CBC
Hematocrit: 37.5 % (ref 34.0–46.6)
Hemoglobin: 12.7 g/dL (ref 11.1–15.9)
MCH: 28.6 pg (ref 26.6–33.0)
MCHC: 33.9 g/dL (ref 31.5–35.7)
MCV: 85 fL (ref 79–97)
Platelets: 372 10*3/uL (ref 150–450)
RBC: 4.44 x10E6/uL (ref 3.77–5.28)
RDW: 13.6 % (ref 11.7–15.4)
WBC: 6.9 10*3/uL (ref 3.4–10.8)

## 2018-04-22 LAB — BASIC METABOLIC PANEL
BUN / CREAT RATIO: 24 — AB (ref 9–23)
BUN: 12 mg/dL (ref 6–20)
CALCIUM: 9.5 mg/dL (ref 8.7–10.2)
CO2: 20 mmol/L (ref 20–29)
CREATININE: 0.51 mg/dL — AB (ref 0.57–1.00)
Chloride: 105 mmol/L (ref 96–106)
GFR calc non Af Amer: 125 mL/min/{1.73_m2} (ref >59)
GFR, EST AFRICAN AMERICAN: 144 mL/min/{1.73_m2} (ref >59)
Glucose: 89 mg/dL (ref 65–99)
Potassium: 4 mmol/L (ref 3.5–5.2)
Sodium: 141 mmol/L (ref 134–144)

## 2018-04-22 LAB — ANTIBODY SCREEN: Antibody Screen: NEGATIVE

## 2018-04-22 LAB — ABO

## 2018-04-22 LAB — BETA HCG QUANT (REF LAB): HCG QUANT: 309 m[IU]/mL

## 2018-04-23 ENCOUNTER — Other Ambulatory Visit: Payer: Managed Care, Other (non HMO)

## 2018-04-23 DIAGNOSIS — Z3200 Encounter for pregnancy test, result unknown: Secondary | ICD-10-CM

## 2018-04-23 DIAGNOSIS — N96 Recurrent pregnancy loss: Secondary | ICD-10-CM

## 2018-04-23 NOTE — Progress Notes (Addendum)
Patient sent to lab for 2nd HCG blood draw. Patient does report feeling crampy this morning. Denies any bleeding. Kathrene Alu RN

## 2018-04-24 LAB — BETA HCG QUANT (REF LAB): hCG Quant: 760 m[IU]/mL

## 2018-04-29 ENCOUNTER — Ambulatory Visit (INDEPENDENT_AMBULATORY_CARE_PROVIDER_SITE_OTHER): Payer: Managed Care, Other (non HMO) | Admitting: Obstetrics & Gynecology

## 2018-04-29 VITALS — BP 131/67 | HR 109 | Wt 175.0 lb

## 2018-04-29 DIAGNOSIS — O2620 Pregnancy care for patient with recurrent pregnancy loss, unspecified trimester: Secondary | ICD-10-CM | POA: Diagnosis not present

## 2018-04-29 DIAGNOSIS — O3680X0 Pregnancy with inconclusive fetal viability, not applicable or unspecified: Secondary | ICD-10-CM | POA: Diagnosis not present

## 2018-04-29 MED ORDER — PROGESTERONE MICRONIZED 200 MG PO CAPS: ORAL_CAPSULE | ORAL | 3 refills | Status: AC

## 2018-04-29 NOTE — Progress Notes (Signed)
Bedside follow up ultrasound reveals no sac and no fetal pole. Patient will need repeat HCG per Dr. Ihor Dow. Kathrene Alu RN

## 2018-04-29 NOTE — Progress Notes (Signed)
History:  33 y.o. G4P0030 here today for pregnancy of unknown location.  Pt has h/o irregular cycles. She reports nausea without emesis and breast tenderness. She denies pelvic pain or vaginal bleeding.  LMP [id number]   The following portions of the patient's history were reviewed and updated as appropriate: allergies, current medications, past family history, past medical history, past social history, past surgical history and problem list.  Review of Systems:  Pertinent items are noted in HPI.    Objective:  Physical Exam Blood pressure 131/67, pulse (!) 109, weight 175 lb (79.4 kg), last menstrual period 02/23/2018.  CONSTITUTIONAL: Well-developed, well-nourished female in no acute distress.  HENT:  Normocephalic, atraumatic EYES: Conjunctivae and EOM are normal. No scleral icterus.  NECK: Normal range of motion SKIN: Skin is warm and dry. No rash noted. Not diaphoretic.No pallor. [place]: Alert and oriented to person, place, and time. Normal coordination.   Labs and Imaging [place] : no IUP or GS.        hCG Quant mIU/mL 760  309 CM  04/24/2018 and 04/22/2018  Assessment & Plan:  Pregnancy of unknown location   Quant HCG today   Begin Prometrium 200mg  vaginally in am if hcg cont to rise. (ofc to call pt)  Pt given s/sx of ectopic pregnancy  Total face-to-face time with patient was 18 min.  Greater than 50% was spent in counseling and coordination of care with the patient.   Montarius Kitagawa L. Harraway-Smith, M.D., Cherlynn June

## 2018-04-30 ENCOUNTER — Telehealth: Payer: Self-pay

## 2018-04-30 DIAGNOSIS — O3680X0 Pregnancy with inconclusive fetal viability, not applicable or unspecified: Secondary | ICD-10-CM

## 2018-04-30 DIAGNOSIS — O2620 Pregnancy care for patient with recurrent pregnancy loss, unspecified trimester: Secondary | ICD-10-CM

## 2018-04-30 LAB — BETA HCG QUANT (REF LAB): hCG Quant: 7293 m[IU]/mL

## 2018-04-30 NOTE — Telephone Encounter (Signed)
Patient called and made aware that HCG is over 7,000 and she can begin the vaginal progestrone.  Explained how to use vaginal progestrone.  Explained to patient I will route to provider for input on follow up plan of care.   Went ahead and scheduled patient for follow up ob appointment in four weeks and will return call to patient once provider has given more input. Kathrene Alu RN

## 2018-04-30 NOTE — Telephone Encounter (Signed)
Patient called and made aware of ultrasound scheduled at [facility] on 05/08/18 at 1:30pm.  Patient accepts appointment. Kathrene Alu RN

## 2018-05-04 ENCOUNTER — Telehealth: Payer: Self-pay

## 2018-05-04 ENCOUNTER — Inpatient Hospital Stay (HOSPITAL_COMMUNITY): Payer: Managed Care, Other (non HMO)

## 2018-05-04 ENCOUNTER — Inpatient Hospital Stay (HOSPITAL_COMMUNITY)
Admission: AD | Admit: 2018-05-04 | Discharge: 2018-05-04 | Disposition: A | Discharge status: Home / Self Care | Payer: Managed Care, Other (non HMO) | Attending: Obstetrics and Gynecology | Admitting: Obstetrics and Gynecology

## 2018-05-04 ENCOUNTER — Other Ambulatory Visit: Payer: Self-pay

## 2018-05-04 ENCOUNTER — Encounter (HOSPITAL_COMMUNITY): Payer: Self-pay | Admitting: *Deleted

## 2018-05-04 DIAGNOSIS — O4690 Antepartum hemorrhage, unspecified, unspecified trimester: Secondary | ICD-10-CM

## 2018-05-04 DIAGNOSIS — R109 Unspecified abdominal pain: Secondary | ICD-10-CM | POA: Insufficient documentation

## 2018-05-04 DIAGNOSIS — O4691 Antepartum hemorrhage, unspecified, first trimester: Secondary | ICD-10-CM

## 2018-05-04 DIAGNOSIS — O23591 Infection of other part of genital tract in pregnancy, first trimester: Secondary | ICD-10-CM | POA: Diagnosis not present

## 2018-05-04 DIAGNOSIS — Z3A10 10 weeks gestation of pregnancy: Secondary | ICD-10-CM | POA: Diagnosis not present

## 2018-05-04 DIAGNOSIS — B9689 Other specified bacterial agents as the cause of diseases classified elsewhere: Secondary | ICD-10-CM | POA: Insufficient documentation

## 2018-05-04 DIAGNOSIS — O209 Hemorrhage in early pregnancy, unspecified: Secondary | ICD-10-CM | POA: Diagnosis present

## 2018-05-04 DIAGNOSIS — Z880 Allergy status to penicillin: Secondary | ICD-10-CM | POA: Insufficient documentation

## 2018-05-04 DIAGNOSIS — N760 Acute vaginitis: Secondary | ICD-10-CM

## 2018-05-04 LAB — CBC
HCT: 39.6 % (ref 36.0–46.0)
Hemoglobin: 12.9 g/dL (ref 12.0–15.0)
MCH: 28.9 pg (ref 26.0–34.0)
MCHC: 32.6 g/dL (ref 30.0–36.0)
MCV: 88.6 fL (ref 80.0–100.0)
Platelets: 299 10*3/uL (ref 150–400)
RBC: 4.47 MIL/uL (ref 3.87–5.11)
RDW: 14.4 % (ref 11.5–15.5)
WBC: 8.9 10*3/uL (ref 4.0–10.5)
nRBC: 0 % (ref 0.0–0.2)

## 2018-05-04 LAB — HCG, QUANTITATIVE, PREGNANCY: HCG, BETA CHAIN, QUANT, S: 35413 m[IU]/mL — AB (ref <5)

## 2018-05-04 LAB — WET PREP, GENITAL
Clue Cells Wet Prep HPF POC: NONE SEEN
Sperm: NONE SEEN
Trich, Wet Prep: NONE SEEN
YEAST WET PREP: NONE SEEN

## 2018-05-04 MED ORDER — METRONIDAZOLE 500 MG PO TABS: 500.0000 mg | ORAL_TABLET | Freq: Two times a day (BID) | ORAL | 0 refills | Status: AC

## 2018-05-04 NOTE — MAU Provider Note (Signed)
History     CSN: [id number]  Arrival date and time: 05/04/18 1016   First Provider Initiated Contact with Patient 05/04/18 1116      Chief Complaint  Patient presents with  . Vaginal Bleeding  . Abdominal Pain   April Weiss is a G4P0030 at [redacted]w[redacted]d presenting with brown and pink spotting since last night. Spotting is seen when wiping and a scant amount on a pad since this morning.  Denies menstrual type cramping. She has occasional sharp pain in the right lower quadrant only with movement, this pain has been happening for over a year. Her history is significant for BV (last year) and 3 prior miscarriages (for which she is on vaginal progesterone).   OB History    Gravida  4   Para      Term      Preterm      AB  3   Living        SAB  3   TAB      Ectopic      Multiple      Live Births              Past Medical History:  Diagnosis Date  . Early puberty   . Estrogen deficiency   . Fibroid   . Headache   . Large uterus   . Reiter's disease [facility])     Past Surgical History:  Procedure Laterality Date  . LAPAROSCOPIC APPENDECTOMY N/A 09/29/2017   Procedure: APPENDECTOMY LAPAROSCOPIC;  Surgeon: Ileana Roup, MD;  Location: [facility];  Service: General;  Laterality: N/A;    Family History  Problem Relation Age of Onset  . Arthritis Father   . Hypertension Mother     Social History   Tobacco Use  . Smoking status: Never Smoker  . Smokeless tobacco: Never Used  Substance Use Topics  . Alcohol use: Never    Frequency: Never  . Drug use: Never    Allergies:  Allergies  Allergen Reactions  . Penicillins Hives    Has patient had a PCN reaction causing immediate rash, facial/tongue/throat swelling, SOB or lightheadedness with hypotension: Yes Has patient had a PCN reaction causing severe rash involving mucus membranes or skin necrosis: No Has patient had a PCN reaction that required hospitalization: No Has patient had a PCN reaction occurring  within the last 10 years: No If all of the above answers are "NO", then may proceed with Cephalosporin use.    Medications Prior to Admission  Medication Sig Dispense Refill Last Dose  . Prenatal Vit-Fe Fumarate-FA (PRENATAL MULTIVITAMIN) TABS tablet Take 1 tablet by mouth daily at 12 noon.   Past Week at Unknown time  . progesterone (PROMETRIUM) 200 MG capsule Place one capsule vaginally at bedtime 30 capsule 3 Past Week at Unknown time    Review of Systems  Constitutional: Negative.   HENT: Negative.   Eyes: Negative.   Respiratory: Negative.   Cardiovascular: Negative.   Gastrointestinal: Negative.   Endocrine: Negative.   Genitourinary: Positive for dyspareunia (with intercourse, due to vaginal dryness). Negative for vaginal pain.  Musculoskeletal: Negative.   Skin: Negative.   Allergic/Immunologic: Negative.   Neurological: Negative.   Hematological: Negative.   Psychiatric/Behavioral: Negative.    Physical Exam   Blood pressure 124/75, pulse 90, temperature 98.9 F (37.2 C), temperature source Oral, resp. rate 18, height 5\' 2"  (1.575 m), weight 78.4 kg, last menstrual period 02/23/2018, SpO2 100 %.  Physical Exam  Nursing note and  vitals reviewed. Constitutional: She is oriented to person, place, and time. She appears well-developed and well-nourished. No distress.  HENT:  Head: Normocephalic.  Eyes: Pupils are equal, round, and reactive to light.  Neck: Normal range of motion.  Cardiovascular: Normal rate, regular rhythm and normal heart sounds.  No murmur heard. Respiratory: Effort normal and breath sounds normal. No respiratory distress.  GI: Soft. Bowel sounds are normal. She exhibits no distension. There is no abdominal tenderness.  Genitourinary:    Uterus normal.     Vaginal discharge (malodorous, purulent discharge seen at cervical os) present.     Genitourinary Comments: Scant bleeding seen at os   Musculoskeletal: Normal range of motion.  Neurological:  She is alert and oriented to person, place, and time.  Skin: Skin is warm and dry. She is not diaphoretic.  Psychiatric: She has a normal mood and affect. Her behavior is normal. Judgment and thought content normal.    MAU Course  Procedures  MDM Quantitative Hcg, CBC TVUS  Assessment and Plan  Confirmed IUP Bacterial vaginosis - 500mg  Flagyl by mouth BID x7 days Continue vaginal progesterone as prescribed Pelvic rest FU with OB provider as scheduled Return to care if vaginal bleeding or cramping   Nealie Mchatton R Masato Pettie 05/04/2018, 12:12 PM

## 2018-05-04 NOTE — Discharge Instructions (Signed)
Vaginal Bleeding During Pregnancy, First Trimester ° °A small amount of bleeding from the vagina (spotting) is relatively common during early pregnancy. It usually stops on its own. Various things may cause bleeding or spotting during early pregnancy. Some bleeding may be related to the pregnancy, and some may not. In many cases, the bleeding is normal and is not a problem. However, bleeding can also be a sign of something serious. Be sure to tell your health care provider about any vaginal bleeding right away. °Some possible causes of vaginal bleeding during the first trimester include: °· Infection or inflammation of the cervix. °· Growths (polyps) on the cervix. °· Miscarriage or threatened miscarriage. °· Pregnancy tissue developing outside of the uterus (ectopic pregnancy). °· A mass of tissue developing in the uterus due to an egg being fertilized incorrectly (molar pregnancy). °Follow these instructions at home: °Activity °· Follow instructions from your health care provider about limiting your activity. Ask what activities are safe for you. °· If needed, make plans for someone to help with your regular activities. °· Do not have sex or orgasms until your health care provider says that this is safe. °General instructions °· Take over-the-counter and prescription medicines only as told by your health care provider. °· Pay attention to any changes in your symptoms. °· Do not use tampons or douche. °· Write down how many pads you use each day, how often you change pads, and how soaked (saturated) they are. °· If you pass any tissue from your vagina, save the tissue so you can show it to your health care provider. °· Keep all follow-up visits as told by your health care provider. This is important. °Contact a health care provider if: °· You have vaginal bleeding during any part of your pregnancy. °· You have cramps or labor pains. °· You have a fever. °Get help right away if: °· You have severe cramps in your  back or abdomen. °· You pass large clots or a large amount of tissue from your vagina. °· Your bleeding increases. °· You feel light-headed or weak, or you faint. °· You have chills. °· You are leaking fluid or have a gush of fluid from your vagina. °Summary °· A small amount of bleeding (spotting) from the vagina is relatively common during early pregnancy. °· Various things may cause bleeding or spotting in early pregnancy. °· Be sure to tell your health care provider about any vaginal bleeding right away. °This information is not intended to replace advice given to you by your health care provider. Make sure you discuss any questions you have with your health care provider. °Document Released: 01/09/2005 Document Revised: 07/04/2016 Document Reviewed: 07/04/2016 °Elsevier Interactive Patient Education © 2019 [employer] ° °

## 2018-05-04 NOTE — MAU Note (Signed)
Is preg.  Since yesterday has been bleeding on and off.  Having some pain in lower part of stomach.  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  Called to office and was instructed to come over here.

## 2018-05-04 NOTE — Telephone Encounter (Signed)
Patient called over the weekend and her bleeding has increased. Patient has been monitored for possible SAB. Patient is having some cramping. Reviewed with Dr. Ihor Dow who advised patient should go to MAU for ultrasound now.   Patient informed of plan of care and given address to [facility] MAU. Patient agreeable with plan. Kathrene Alu RN

## 2018-05-05 LAB — GC/CHLAMYDIA PROBE AMP (~~LOC~~) NOT AT ARMC
Chlamydia: NEGATIVE
Neisseria Gonorrhea: NEGATIVE

## 2018-05-08 ENCOUNTER — Other Ambulatory Visit (HOSPITAL_BASED_OUTPATIENT_CLINIC_OR_DEPARTMENT_OTHER): Payer: Managed Care, Other (non HMO)

## 2018-05-22 ENCOUNTER — Encounter: Payer: Managed Care, Other (non HMO) | Admitting: Obstetrics & Gynecology

## 2018-05-27 ENCOUNTER — Ambulatory Visit (INDEPENDENT_AMBULATORY_CARE_PROVIDER_SITE_OTHER): Payer: Managed Care, Other (non HMO) | Admitting: Obstetrics & Gynecology

## 2018-05-27 VITALS — BP 118/78 | HR 103 | Wt 176.0 lb

## 2018-05-27 DIAGNOSIS — O021 Missed abortion: Secondary | ICD-10-CM | POA: Diagnosis not present

## 2018-05-27 DIAGNOSIS — Z1389 Encounter for screening for other disorder: Secondary | ICD-10-CM

## 2018-05-27 DIAGNOSIS — O09899 Supervision of other high risk pregnancies, unspecified trimester: Secondary | ICD-10-CM

## 2018-05-27 LAB — POCT URINALYSIS DIPSTICK OB
Blood, UA: NEGATIVE
Glucose, UA: NEGATIVE
Nitrite, UA: NEGATIVE
Spec Grav, UA: 1.015 (ref 1.010–1.025)
pH, UA: 7 (ref 5.0–8.0)

## 2018-05-27 LAB — POCT URINE PREGNANCY: PREG TEST UR: POSITIVE — AB

## 2018-05-27 MED ORDER — MISOPROSTOL 200 MCG PO TABS: ORAL_TABLET | ORAL | 0 refills | Status: AC

## 2018-05-27 MED ORDER — TRAMADOL HCL 50 MG PO TABS: 50.0000 mg | ORAL_TABLET | Freq: Four times a day (QID) | ORAL | 0 refills | Status: AC | PRN

## 2018-05-27 MED ORDER — IBUPROFEN 800 MG PO TABS: 800.0000 mg | ORAL_TABLET | Freq: Three times a day (TID) | ORAL | 0 refills | Status: AC | PRN

## 2018-06-01 ENCOUNTER — Ambulatory Visit (INDEPENDENT_AMBULATORY_CARE_PROVIDER_SITE_OTHER): Payer: Managed Care, Other (non HMO)

## 2018-06-01 ENCOUNTER — Ambulatory Visit: Payer: Managed Care, Other (non HMO)

## 2018-06-01 ENCOUNTER — Telehealth: Payer: Self-pay

## 2018-06-01 VITALS — BP 125/79 | HR 101 | Ht 62.0 in | Wt 174.0 lb

## 2018-06-01 DIAGNOSIS — O021 Missed abortion: Secondary | ICD-10-CM | POA: Diagnosis not present

## 2018-06-01 DIAGNOSIS — Z2913 Encounter for prophylactic Rho(D) immune globulin: Secondary | ICD-10-CM

## 2018-06-01 MED ORDER — RHO D IMMUNE GLOBULIN 1500 UNIT/2ML IJ SOSY
300.0000 ug | PREFILLED_SYRINGE | Freq: Once | INTRAMUSCULAR | Status: AC
  Administered 2018-06-01: 300 ug via INTRAMUSCULAR

## 2018-06-01 NOTE — Telephone Encounter (Signed)
-----   Message from Lavonia Drafts, MD sent at 06/01/2018 12:28 PM EST ----- Regarding: Needs Rhogam ASAP This pt had a missed AB from last week. Her blood type is AB NEG. She needs to come in ASAP for Rhogam.   Thx, Clh-S

## 2018-06-01 NOTE — Progress Notes (Addendum)
Debe Coder here for Rhogam  Injection.  Injection administered without complication.   chiquita l wilson, CMA 06/01/2018  4:15 PM  Attestation of Attending Supervision of RN: Evaluation and management procedures were performed by the nurse under my supervision and collaboration.  I have reviewed the nursing note and chart, and I agree with the management and plan.  Carolyn L. Harraway-Smith, M.D., Cherlynn June

## 2018-06-01 NOTE — Telephone Encounter (Signed)
Called pt to schedule an appointment for rhogam injection.Pt is scheduled to come in on 06/05/18. Understanding was voiced.  Matin Mattioli l Dionna Wiedemann, CMA

## 2018-06-01 NOTE — Progress Notes (Signed)
History:  33 y.o. G4P0030 here today for NOB vist. Pt reprts that she still feel pregnancy sx. She denies bleeding or abd cramping.   The following portions of the patient's history were reviewed and updated as appropriate: allergies, current medications, past family history, past medical history, past social history, past surgical history and problem list.  Review of Systems:  Pertinent items are noted in HPI.    Objective:  Physical Exam Blood pressure 118/78, pulse (!) 103, weight 176 lb 0.6 oz (79.9 kg), last menstrual period 02/23/2018.  CONSTITUTIONAL: Well-developed, well-nourished female in no acute distress.  HENT:  Normocephalic, atraumatic EYES: Conjunctivae and EOM are normal. No scleral icterus.  NECK: Normal range of motion SKIN: Skin is warm and dry. No rash noted. Not diaphoretic.No pallor. [place]: Alert and oriented to person, place, and time. Normal coordination.   Abd: Soft, nontender and nondistended Pelvic: Normal appearing external genitalia  Labs and Imaging Ofc [place] no FHR. 2 week lag in growth. I repeated the [place] for confirmation.   Assessment & Plan:  Missed AB/ h/o recurrent AB:  Reviewed dx with pt.   Give the option of D&C, expectant management or cytotec. Pt opts for cytotec.   cytotec 869mcg PV q 12 hours x 2  Pt to f/u in 1-2 weeks or sooner prn  Tramadol 50mg  po q 6 hours prn  NSAIDS prn  Total face-to-face time with patient was 25 min.  Greater than 50% was spent in counseling and coordination of care with the patient.   Addeddum : blood type AB neg. Pt to be called in for Rhogam  Keriann Rankin L. Harraway-Smith, M.D., Cherlynn June

## 2018-06-03 ENCOUNTER — Telehealth: Payer: Self-pay

## 2018-06-03 NOTE — Telephone Encounter (Signed)
Patient called wanting to give update. She did take the cytotec on Friday (Feb 14) and states she did start bleeding about 1.5 hours after taking first dose. Patient states that yesterday (Feb 18) she started having some pain in the groin area and did have to take one tramadol. Patient has returned to work.  Patient states she is still having heavy bleeding with some clots today. Patient just wanted to make sure this is ok. Will route to provider for review.  Patient has follow up appointment Friday Feb 21.Kathrene Alu RN

## 2018-06-05 ENCOUNTER — Ambulatory Visit (INDEPENDENT_AMBULATORY_CARE_PROVIDER_SITE_OTHER): Payer: Managed Care, Other (non HMO) | Admitting: Obstetrics & Gynecology

## 2018-06-05 VITALS — BP 111/52 | HR 90 | Wt 176.0 lb

## 2018-06-05 DIAGNOSIS — N96 Recurrent pregnancy loss: Secondary | ICD-10-CM

## 2018-06-05 NOTE — Progress Notes (Signed)
Patient having cramping and clotting still. Patient states she didn't not feel good overall last night. Kathrene Alu RN

## 2018-06-07 ENCOUNTER — Encounter: Payer: Self-pay | Admitting: Obstetrics & Gynecology

## 2018-06-07 NOTE — Progress Notes (Signed)
History:  33 y.o. G4P0030 here today for f/u of missed ab.  She reports that she took the cytotec by mouth 6 days ago. She reports that she is still having some heavy bleeding at times. She has some cramping but it is not sever. She reports that she is having emotion al challenges as she does not know anyone else who has ever gone through what she is going through with the recurrent pregnancy losses.      The following portions of the patient's history were reviewed and updated as appropriate: allergies, current medications, past family history, past medical history, past social history, past surgical history and problem list.  Review of Systems:  Pertinent items are noted in HPI.    Objective:  Physical Exam Blood pressure (!) 111/52, pulse 90, weight 176 lb (79.8 kg), last menstrual period 02/23/2018.  CONSTITUTIONAL: Well-developed, well-nourished female in no acute distress.  HENT:  Normocephalic, atraumatic EYES: Conjunctivae and EOM are normal. No scleral icterus.  NECK: Normal range of motion SKIN: Skin is warm and dry. No rash noted. Not diaphoretic.No pallor. [place]: Alert and oriented to person, place, and time. Normal coordination.  Abd: Soft, nontender and nondistended Pelvic: exam deferred  Assessment & Plan:  Recurrent pregnancy loss. S/p Cytotec for missed AB  repeat dose of Cytotec tonight  F/u in 1 week or sooner pnr  Referral to behavioral health. Appt made for Tues.   Total face-to-face time with patient was 15 min.  Greater than 50% was spent in counseling and coordination of care with the patient.   De Jaworski L. Harraway-Smith, M.D., Cherlynn June

## 2018-06-09 ENCOUNTER — Ambulatory Visit (INDEPENDENT_AMBULATORY_CARE_PROVIDER_SITE_OTHER): Payer: Managed Care, Other (non HMO) | Admitting: Clinical

## 2018-06-09 DIAGNOSIS — F4321 Adjustment disorder with depressed mood: Secondary | ICD-10-CM | POA: Diagnosis not present

## 2018-06-09 NOTE — BH Specialist Note (Signed)
Integrated Behavioral Health Initial Visit  MRN: [id number] Name: April Weiss  Number of [place] Clinician visits:: 1/6 Session Start time: 4:00 Session End time: 5:29 Total time: 1 hour  Type of Service: [place] Interpretor:No. Interpretor Name and Language: n/a   Warm Hand Off Completed.       SUBJECTIVE: April Weiss is a 33 y.o. female accompanied by n/a Patient was referred by April Drafts, MD for grief after multiple losses. Patient reports the following symptoms/concerns: Pt states her primary concern today is grieving loss of her most current pregnancy, after three previous losses.  Duration of problem: 10 days; Severity of problem: moderate  OBJECTIVE: Mood: Normal and Affect: Appropriate Risk of harm to self or others: No plan to harm self or others  LIFE CONTEXT: Family and Social: Pt lives with her husband of almost 8 years School/Work: Pt works full-time Self-Care: Recognizing a greater need for self-care Life Changes: 4th Miscarriage  GOALS ADDRESSED: Patient will: 1. Reduce symptoms of: anxiety and depression 2. Increase knowledge and/or ability of: healthy habits  3. Demonstrate ability to: Increase healthy adjustment to current life circumstances and Begin healthy grieving over loss  INTERVENTIONS: Interventions utilized: Supportive Counseling, Psychoeducation and/or Health Education and Link to [employer]  Standardized Assessments completed: GAD-7 and PHQ 9  ASSESSMENT: Patient currently experiencing Grief.   Patient may benefit from psychoeducation and brief therapeutic interventions regarding coping with symptoms of grief .  PLAN: 1. Follow up with behavioral health clinician on : One week phone follow-up 2. Behavioral recommendations:  -Begin using daily self-care strategies, as discussed in office visit -Read educational materials regarding coping with symptoms of  grief, anxiety and depression -Consider online pregnancy loss support group for additional support 3. Referral(s): [place] (In Clinic) and [employer]:  pregnancy loss support 4. "From scale of 1-10, how likely are you to follow plan?": 9  Caroleen Hamman Emory Leaver, LCSW  Depression screen [facility] 2/9 06/09/2018  Decreased Interest 2  Down, Depressed, Hopeless 2  PHQ - 2 Score 4  Altered sleeping 1  Tired, decreased energy 1  Change in appetite 2  Feeling bad or failure about yourself  2  Trouble concentrating 0  Moving slowly or fidgety/restless 0  Suicidal thoughts 0  PHQ-9 Score 10   GAD 7 : Generalized Anxiety Score 06/09/2018  Nervous, Anxious, on Edge 1  Control/stop worrying 2  Worry too much - different things 2  Trouble relaxing 1  Restless 0  Easily annoyed or irritable 2  Afraid - awful might happen 0  Total GAD 7 Score 8

## 2018-06-16 ENCOUNTER — Telehealth: Payer: Self-pay | Admitting: Clinical

## 2018-06-16 NOTE — Telephone Encounter (Signed)
Follow-up call, as agreed-upon by patient: pt says her grief is starting to feel more manageable, as she has implemented daily activities to help her cope- obtained a "walkman", started walking daily(on non-rainy days) and has began cooking and baking again yesterday. Pt feels she is moving in a positive direction.

## 2018-06-17 ENCOUNTER — Ambulatory Visit: Payer: Managed Care, Other (non HMO) | Admitting: Obstetrics & Gynecology

## 2019-03-24 ENCOUNTER — Encounter (HOSPITAL_COMMUNITY): Payer: Self-pay

## 2020-03-05 IMAGING — CT CT ABD-PELV W/ CM
1 of 2 series · 15 of 32 positions shown, 19 images · IV contrast (OMNIPAQUE)
Comparison: None.

CLINICAL DATA: Right-sided abdominal pain since early this morning.

EXAM:
CT ABDOMEN AND PELVIS WITH CONTRAST
TECHNIQUE: Multidetector CT imaging of the abdomen and pelvis was performed
using the standard protocol following bolus administration of
intravenous contrast.
CONTRAST:  100mL BH034P-ZOO IOPAMIDOL (BH034P-ZOO) INJECTION 61%

[Series 2: routine abdomen/pelvis with · axial · 0.68mm/px · z∈[+643,+1043]mm · 15 of 88 slices shown, 19 images]
[im 4/88  soft-tissue]
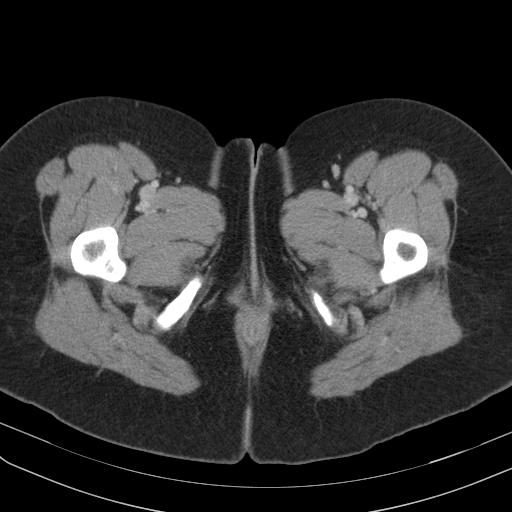
[im 4/88  bone]
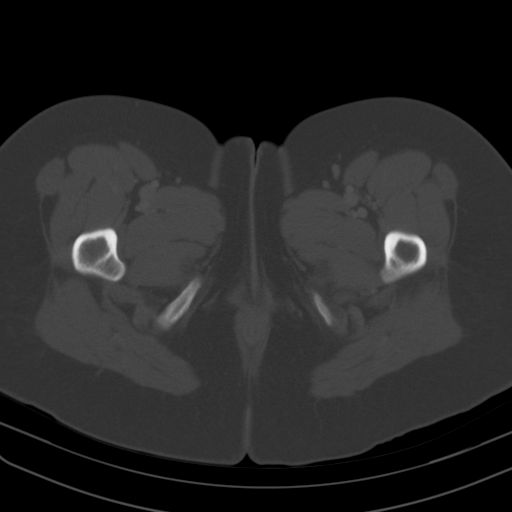
[im 11/88  soft-tissue]
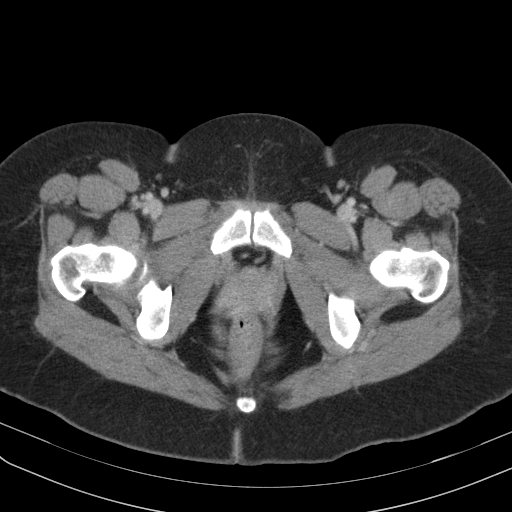
[im 17/88  soft-tissue]
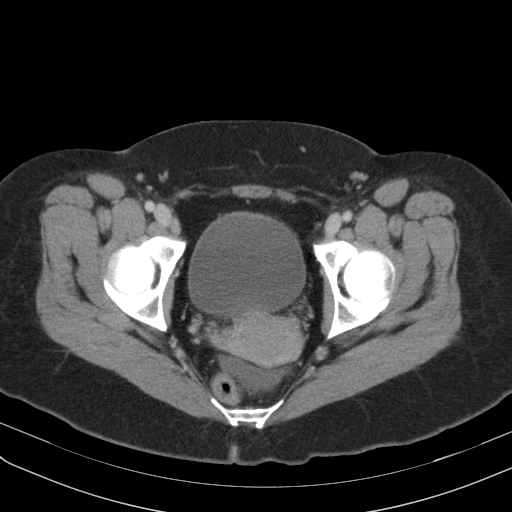
[im 24/88  soft-tissue]
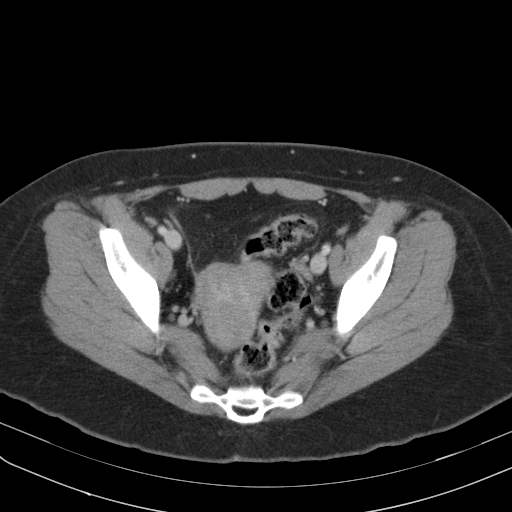
[im 31/88  soft-tissue]
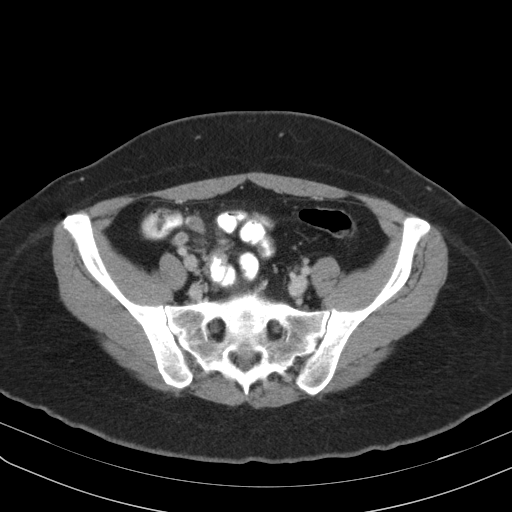
[im 37/88  soft-tissue]
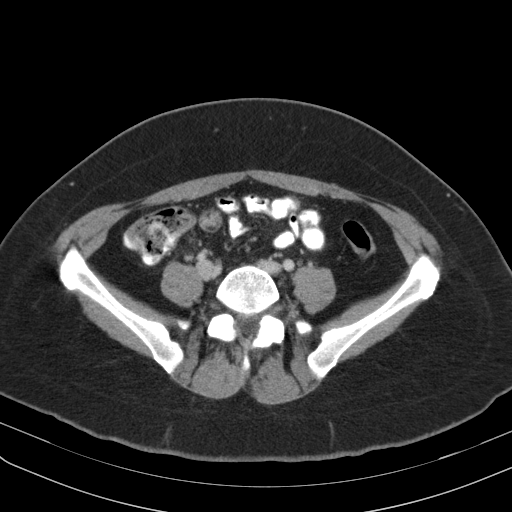
[im 44/88  soft-tissue]
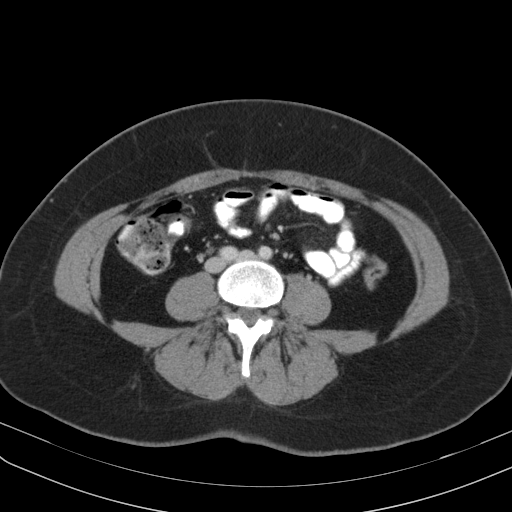
[im 51/88  soft-tissue]
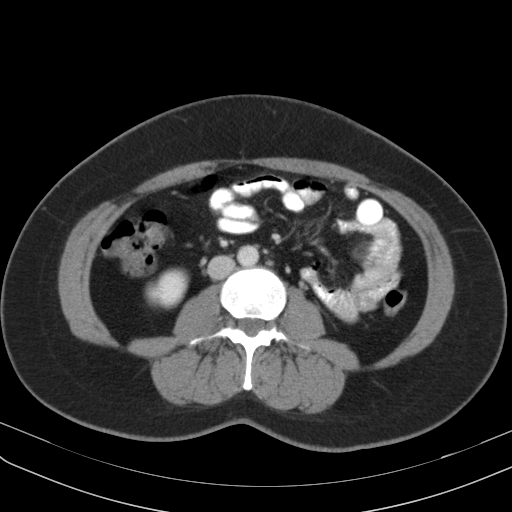
[im 57/88  soft-tissue]
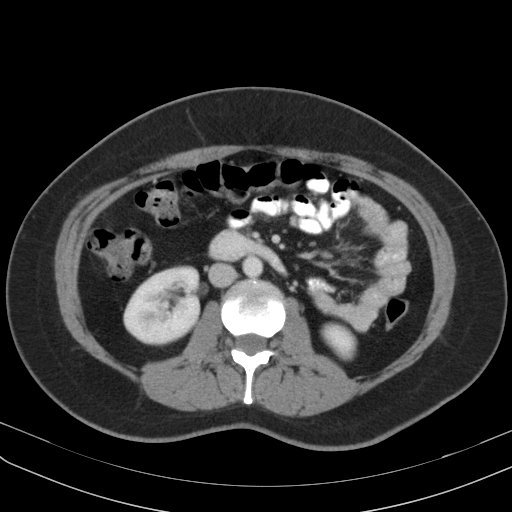
[im 57/88  bone]
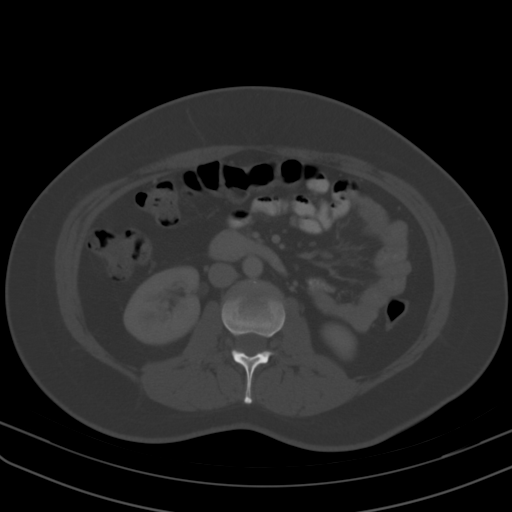
[im 64/88  soft-tissue]
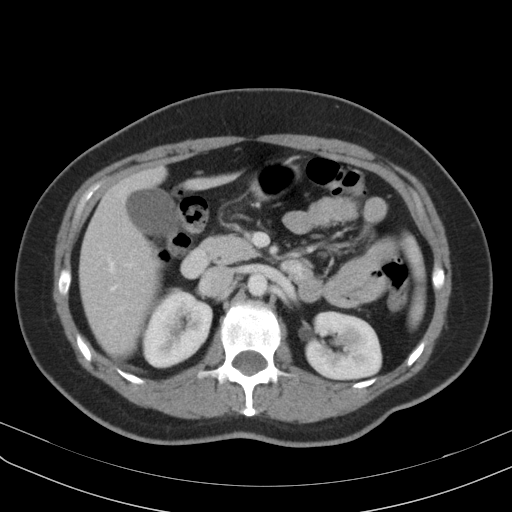
[im 71/88  soft-tissue]
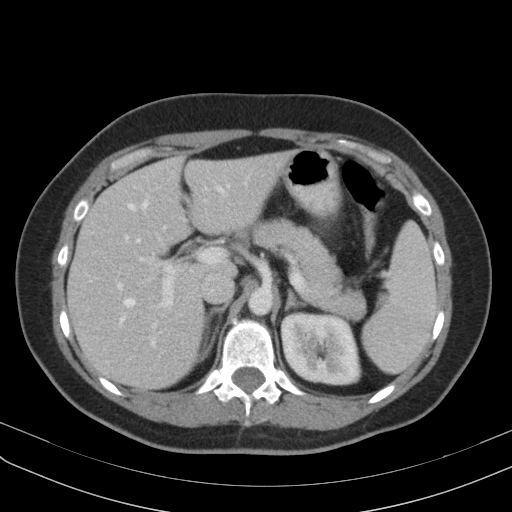
[im 74/88  lung]
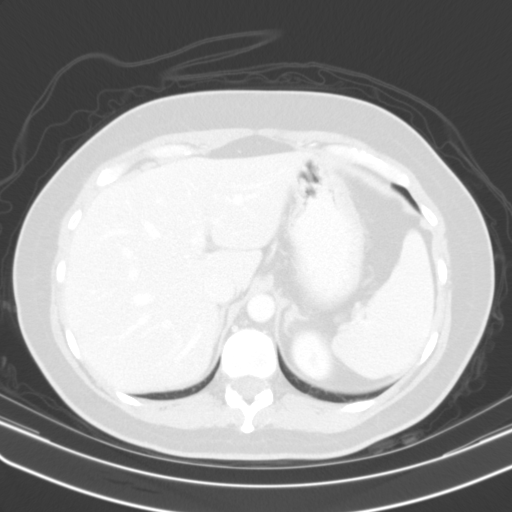
[im 77/88  soft-tissue]
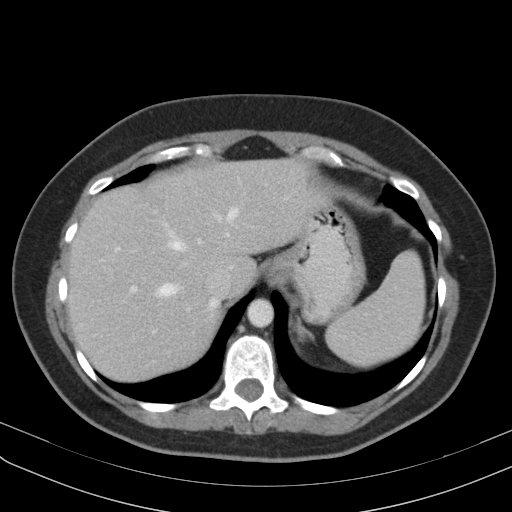
[im 77/88  lung]
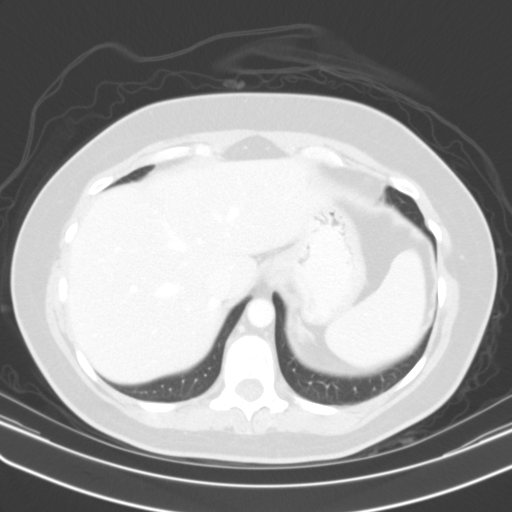
[im 81/88  lung]
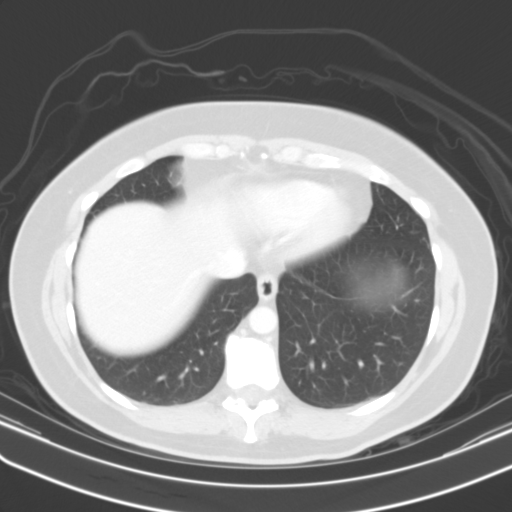
[im 84/88  soft-tissue]
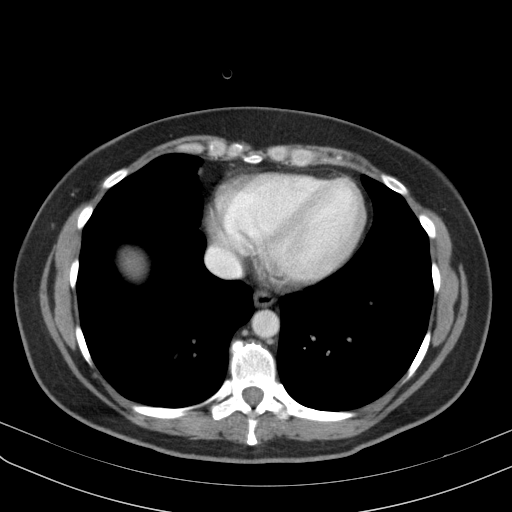
[im 84/88  lung]
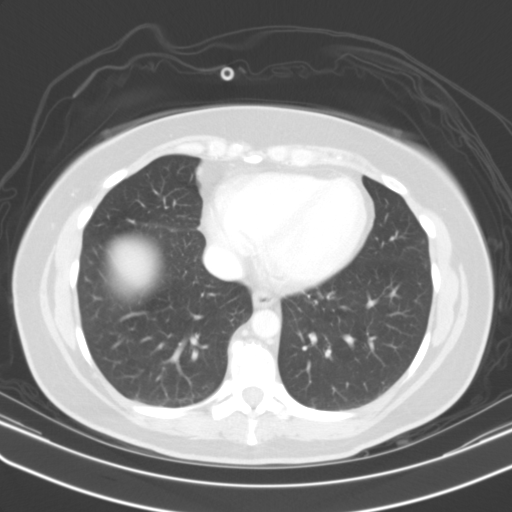

[15 of 32 positions shown; findings below may reference images not displayed]

FINDINGS: Lower chest: The lung bases are clear of acute process. No pleural
effusion or pulmonary lesions. The heart is normal in size. No
pericardial effusion. The distal esophagus and aorta are
unremarkable.

Hepatobiliary: No focal hepatic lesions or intrahepatic biliary
dilatation. The gallbladder is normal. No common bile duct
dilatation.

Pancreas: No mass, inflammation or ductal dilatation.

Spleen: Normal size.  No focal lesions.

Adrenals/Urinary Tract: The adrenal glands and kidneys are
unremarkable. No renal, ureteral or bladder calculi or mass.

Stomach/Bowel: The stomach, duodenum, small bowel and colon are
unremarkable. No acute inflammatory changes, mass lesions or
obstructive findings. The terminal ileum is normal.

The appendix is abnormal. It is thickened, edematous and
demonstrates surrounding inflammatory change. Maximum diameter is 12
mm. No definite appendicoliths. No findings to suggest ruptured
appendicitis.

Vascular/Lymphatic: The aorta is normal in caliber. No dissection.
The branch vessels are patent. The major venous structures are
patent. No mesenteric or retroperitoneal mass or adenopathy. Small
scattered lymph nodes are noted.

Reproductive: Large right-sided uterine fibroid. Both ovaries appear
normal.

Other: Small amount of free pelvic fluid noted. No pelvic
adenopathy. No inguinal adenopathy.

Musculoskeletal: No significant bony findings.
IMPRESSION: 1. CT findings consistent with acute appendicitis. No appendicoliths
or evidence of rupture.
2. Uterine fibroids.

## 2020-10-08 IMAGING — US US OB < 14 WEEKS - US OB TV
1 series · 15 of 28 positions shown · non-contrast
Comparison: None

CLINICAL DATA: Vaginal bleeding in pregnancy

EXAM:
OBSTETRIC <14 WK US AND TRANSVAGINAL OB US
TECHNIQUE: Both transabdominal and transvaginal ultrasound examinations were
performed for complete evaluation of the gestation as well as the
maternal uterus, adnexal regions, and pelvic cul-de-sac.
Transvaginal technique was performed to assess early pregnancy.

[Series 1: us ob < 14 weeks - us ob tv · 60 acquisitions, 15 frames shown]
[im 1/60]
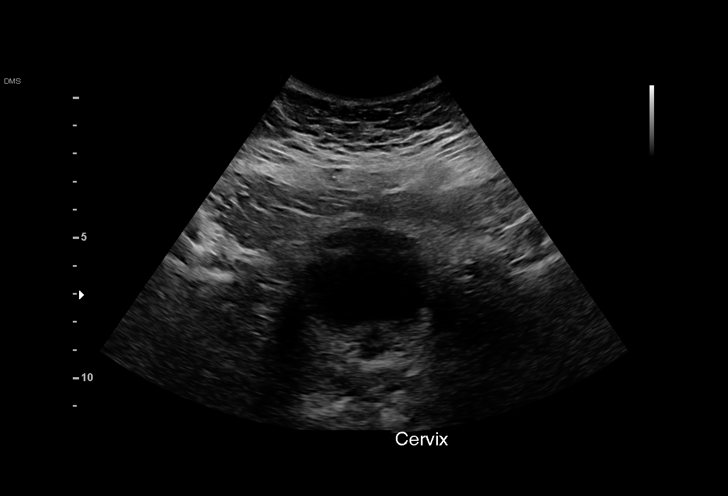
[im 5/60]
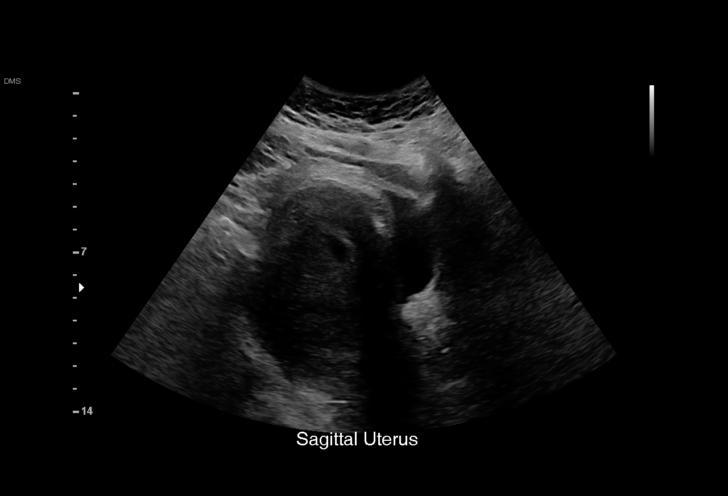
[im 9/60]
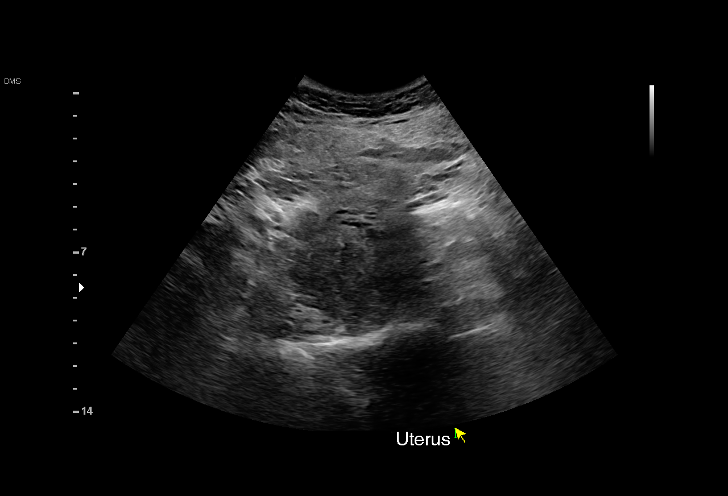
[im 14/60]
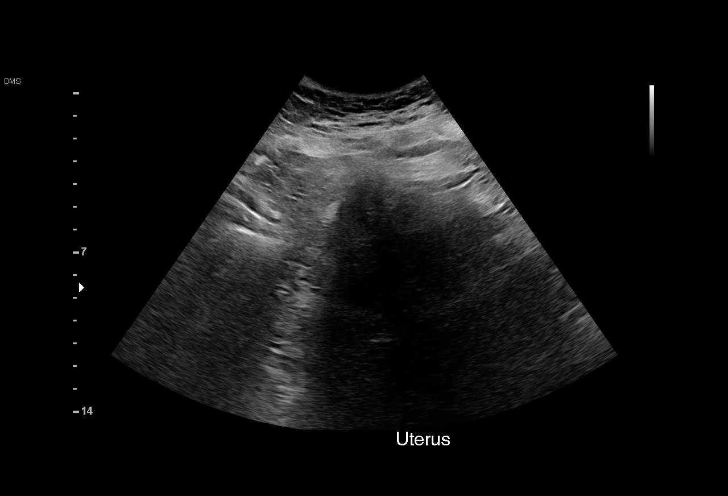
[im 18/60]
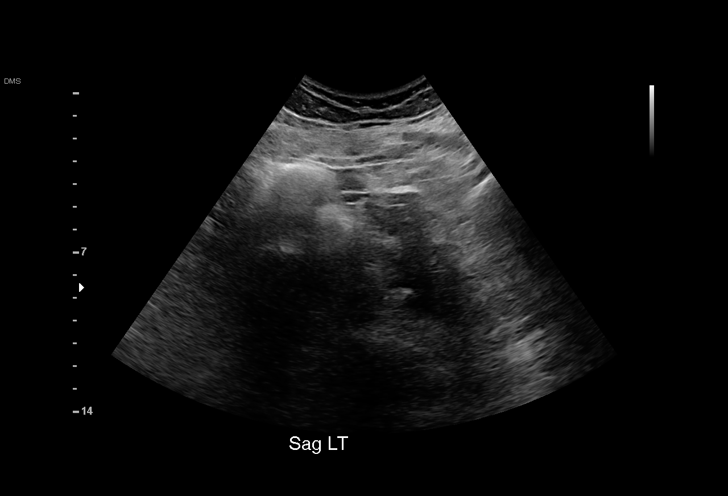
[im 22/60]
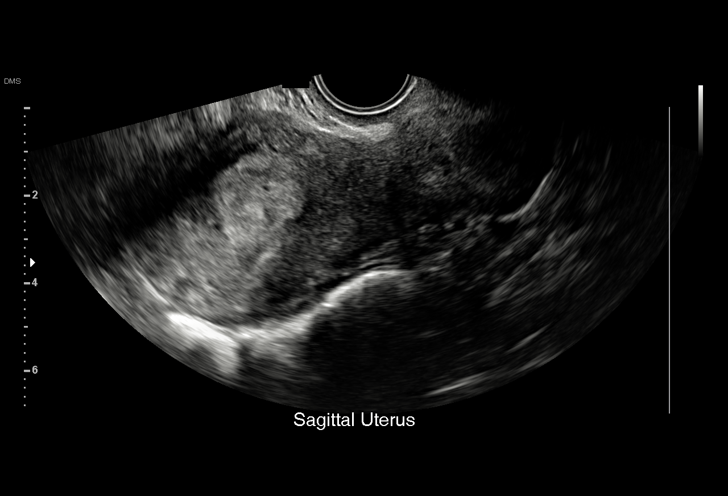
[im 27/60]
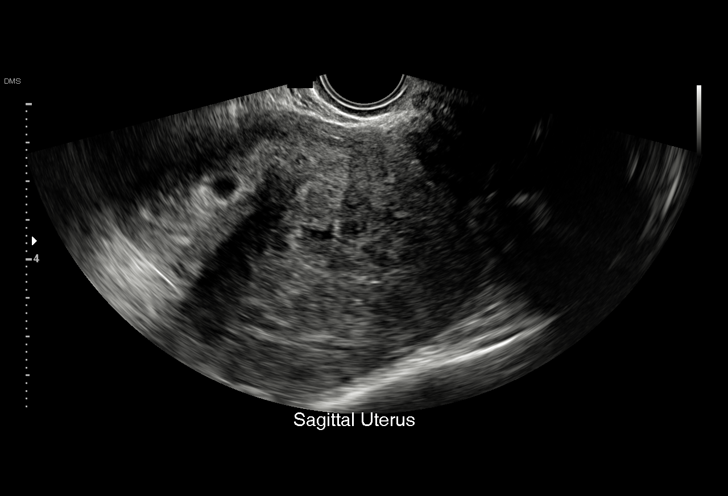
[im 31/60]
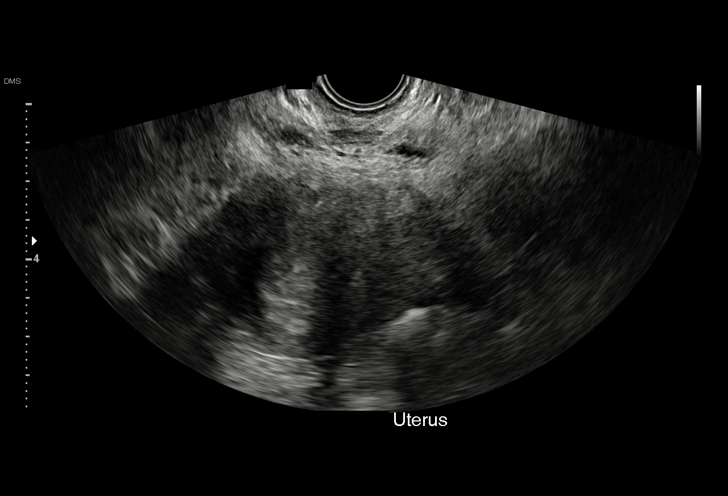
[im 33/60]
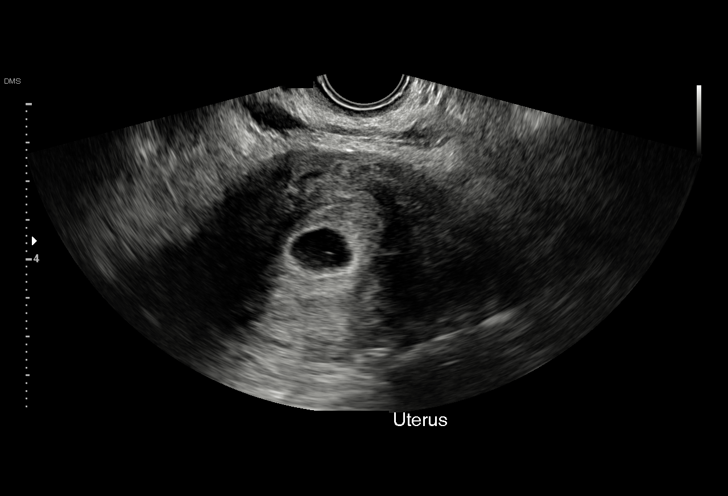
[im 38/60]
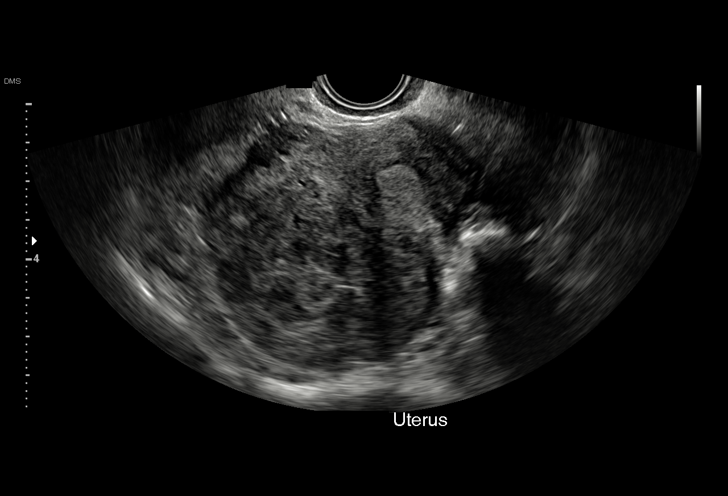
[im 42/60]
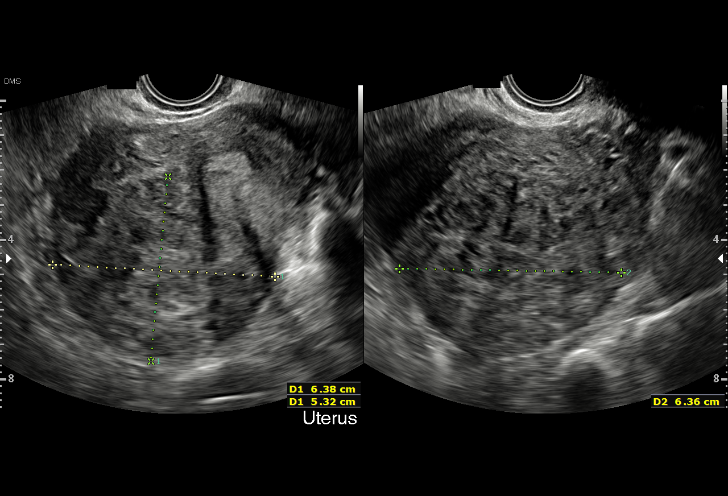
[im 46/60]
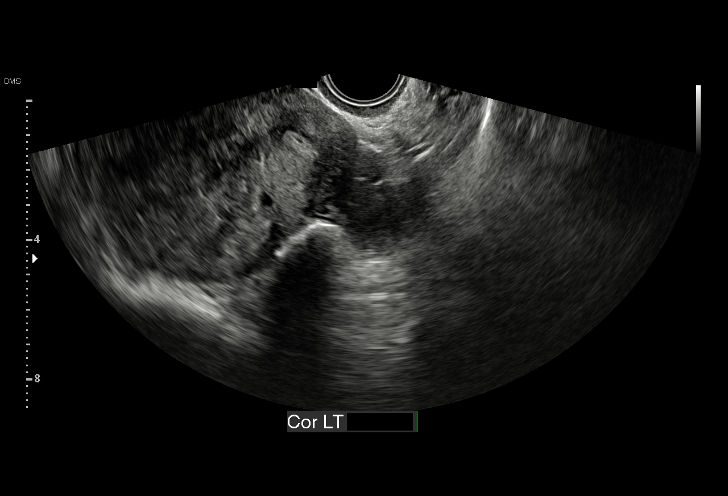
[im 51/60]
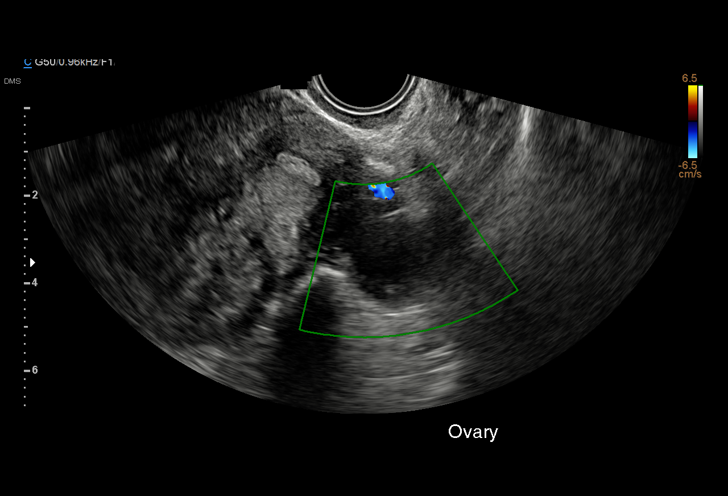
[im 55/60]
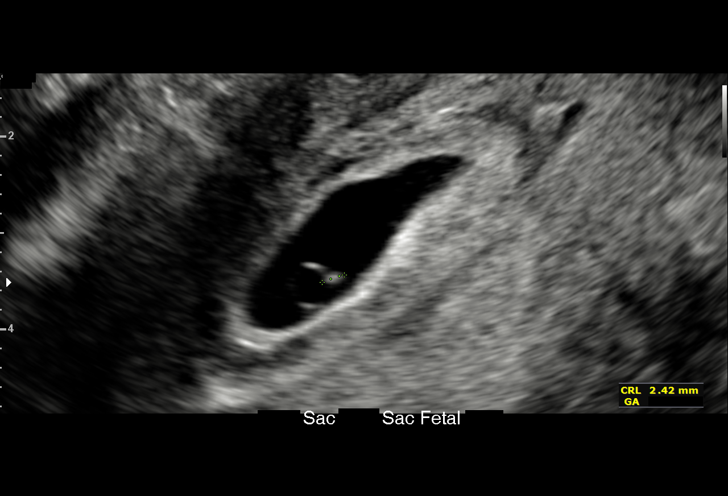
[im 60/60]
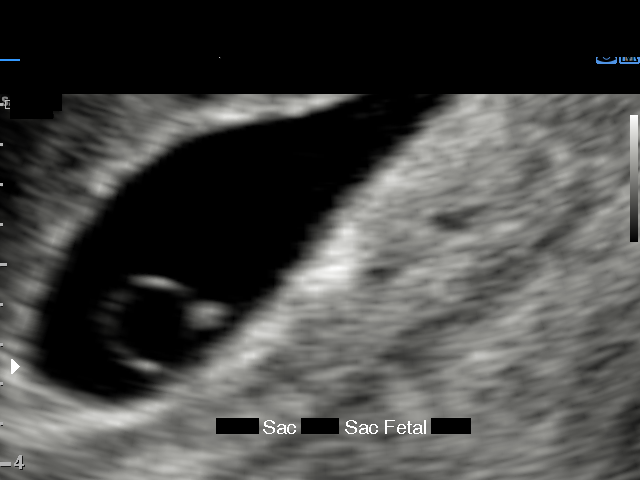

[15 of 28 positions shown; findings below may reference images not displayed]

FINDINGS: Intrauterine gestational sac: Single

Yolk sac:  Yes

Embryo:  Yes

Cardiac Activity: Yes

Heart Rate: 107 bpm

CRL:  2.38 mm   5 w   5 d                  US EDC: 12/30/2018

Maternal uterus/adnexae:

Subchorionic hemorrhage: None visualized

Right ovary: Not visualized

Left ovary: Normal

Other :Fibroid within the right side of the uterine fundus is
identified measuring 6.4 x 5.3 x 6.4 cm.

Free fluid:  Trace
IMPRESSION: 1. Single living intrauterine gestation has a gestational age of 5
weeks and 5 days.
2. Uterine fibroid measuring 6.4 cm in maximum dimension.
